# Patient Record
Sex: Female | Born: 1985 | Race: White | Hispanic: No | Marital: Single | Smoking: Former smoker
Health system: Southern US, Community
[De-identification: ages and names within clinical notes are randomized; demographics above are authoritative.]

## PROBLEM LIST (undated history)

## (undated) DIAGNOSIS — J45909 Unspecified asthma, uncomplicated: Secondary | ICD-10-CM

## (undated) HISTORY — PX: NO PAST SURGERIES: SHX2092

---
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
## 2007-03-15 ENCOUNTER — Emergency Department: Payer: Self-pay | Admitting: Emergency Medicine

## 2007-03-22 ENCOUNTER — Emergency Department: Payer: Self-pay | Admitting: Emergency Medicine

## 2007-08-14 ENCOUNTER — Emergency Department: Payer: Self-pay | Admitting: Emergency Medicine

## 2008-03-09 ENCOUNTER — Emergency Department: Payer: Self-pay | Admitting: Emergency Medicine

## 2008-06-24 ENCOUNTER — Emergency Department: Payer: Self-pay | Admitting: Emergency Medicine

## 2008-11-29 ENCOUNTER — Emergency Department: Payer: Self-pay | Admitting: Emergency Medicine

## 2009-03-09 ENCOUNTER — Emergency Department: Payer: Self-pay | Admitting: Emergency Medicine

## 2011-03-27 ENCOUNTER — Emergency Department: Payer: Self-pay | Admitting: *Deleted

## 2011-08-28 ENCOUNTER — Emergency Department: Payer: Self-pay | Admitting: Emergency Medicine

## 2011-08-29 LAB — COMPREHENSIVE METABOLIC PANEL
Anion Gap: 7 (ref 7–16)
Bilirubin,Total: 0.2 mg/dL (ref 0.2–1.0)
Chloride: 105 mmol/L (ref 98–107)
Co2: 28 mmol/L (ref 21–32)
EGFR (African American): >60
EGFR (Non-African Amer.): >60
Potassium: 3.4 mmol/L — ABNORMAL LOW (ref 3.5–5.1)
SGOT(AST): 18 U/L (ref 15–37)
SGPT (ALT): 19 U/L

## 2011-08-29 LAB — URINALYSIS, COMPLETE
Bacteria: NONE SEEN
Bilirubin,UR: NEGATIVE
Glucose,UR: NEGATIVE mg/dL (ref 0–75)
Ketone: NEGATIVE
Nitrite: NEGATIVE
Ph: 7 (ref 4.5–8.0)
RBC,UR: 3 /HPF (ref 0–5)
Squamous Epithelial: 3
WBC UR: 2 /HPF (ref 0–5)

## 2011-08-29 LAB — CBC
MCH: 31.2 pg (ref 26.0–34.0)
MCHC: 33.6 g/dL (ref 32.0–36.0)
Platelet: 296 10*3/uL (ref 150–440)
RBC: 4.46 10*6/uL (ref 3.80–5.20)

## 2011-08-29 LAB — PREGNANCY, URINE: Pregnancy Test, Urine: NEGATIVE m[IU]/mL

## 2011-09-26 ENCOUNTER — Emergency Department: Payer: Self-pay | Admitting: *Deleted

## 2011-09-26 LAB — URINALYSIS, COMPLETE
Bacteria: NONE SEEN
Blood: NEGATIVE
Glucose,UR: NEGATIVE mg/dL (ref 0–75)
Ketone: NEGATIVE
Nitrite: NEGATIVE
Specific Gravity: 1.001 (ref 1.003–1.030)
Squamous Epithelial: <1
WBC UR: <1 /HPF (ref 0–5)

## 2011-09-26 LAB — PREGNANCY, URINE: Pregnancy Test, Urine: NEGATIVE m[IU]/mL

## 2012-07-08 ENCOUNTER — Emergency Department: Payer: Self-pay | Admitting: Emergency Medicine

## 2012-08-13 ENCOUNTER — Emergency Department: Payer: Self-pay | Admitting: Unknown Physician Specialty

## 2012-08-13 LAB — COMPREHENSIVE METABOLIC PANEL
Albumin: 4.1 g/dL (ref 3.4–5.0)
Alkaline Phosphatase: 66 U/L (ref 50–136)
Anion Gap: 5 — ABNORMAL LOW (ref 7–16)
BUN: 14 mg/dL (ref 7–18)
Bilirubin,Total: 0.3 mg/dL (ref 0.2–1.0)
Calcium, Total: 9 mg/dL (ref 8.5–10.1)
Chloride: 108 mmol/L — ABNORMAL HIGH (ref 98–107)
Co2: 26 mmol/L (ref 21–32)
EGFR (African American): >60
EGFR (Non-African Amer.): >60
Glucose: 98 mg/dL (ref 65–99)
Potassium: 3.7 mmol/L (ref 3.5–5.1)
SGOT(AST): 14 U/L — ABNORMAL LOW (ref 15–37)
SGPT (ALT): 15 U/L (ref 12–78)

## 2012-08-13 LAB — URINALYSIS, COMPLETE
Bilirubin,UR: NEGATIVE
Ketone: NEGATIVE
Ph: 6 (ref 4.5–8.0)
Protein: NEGATIVE
Specific Gravity: 1.017 (ref 1.003–1.030)
Squamous Epithelial: 2
WBC UR: 6 /HPF (ref 0–5)

## 2012-08-13 LAB — CBC
HCT: 39 % (ref 35.0–47.0)
HGB: 13.2 g/dL (ref 12.0–16.0)
MCH: 30.4 pg (ref 26.0–34.0)
MCHC: 34 g/dL (ref 32.0–36.0)
RBC: 4.35 10*6/uL (ref 3.80–5.20)
WBC: 11.9 10*3/uL — ABNORMAL HIGH (ref 3.6–11.0)

## 2012-08-13 LAB — MAGNESIUM: Magnesium: 1.8 mg/dL

## 2012-08-13 LAB — LIPASE, BLOOD: Lipase: 81 U/L (ref 73–393)

## 2012-09-06 ENCOUNTER — Emergency Department: Payer: Self-pay | Admitting: Emergency Medicine

## 2012-09-08 ENCOUNTER — Emergency Department: Payer: Self-pay | Admitting: Emergency Medicine

## 2012-09-08 LAB — URINALYSIS, COMPLETE
Bacteria: NONE SEEN
Bilirubin,UR: NEGATIVE
Glucose,UR: NEGATIVE mg/dL (ref 0–75)
Ketone: NEGATIVE
Leukocyte Esterase: NEGATIVE
Nitrite: NEGATIVE
Ph: 6 (ref 4.5–8.0)
Protein: NEGATIVE
RBC,UR: 15 /HPF (ref 0–5)
Specific Gravity: 1.014 (ref 1.003–1.030)
WBC UR: 4 /HPF (ref 0–5)

## 2012-09-08 LAB — CBC WITH DIFFERENTIAL/PLATELET
Basophil #: 0.1 10*3/uL (ref 0.0–0.1)
Eosinophil #: 0.1 10*3/uL (ref 0.0–0.7)
Eosinophil %: 1.2 %
HCT: 38.4 % (ref 35.0–47.0)
HGB: 13.3 g/dL (ref 12.0–16.0)
Lymphocyte #: 2.1 10*3/uL (ref 1.0–3.6)
MCH: 30.9 pg (ref 26.0–34.0)
MCHC: 34.7 g/dL (ref 32.0–36.0)
Monocyte %: 7.4 %
Neutrophil #: 7.9 10*3/uL — ABNORMAL HIGH (ref 1.4–6.5)
RBC: 4.33 10*6/uL (ref 3.80–5.20)
RDW: 13.3 % (ref 11.5–14.5)

## 2013-04-14 ENCOUNTER — Emergency Department: Payer: Self-pay | Admitting: Internal Medicine

## 2013-09-28 ENCOUNTER — Emergency Department: Payer: Self-pay | Admitting: Emergency Medicine

## 2014-03-21 ENCOUNTER — Emergency Department: Payer: Self-pay | Admitting: Emergency Medicine

## 2014-03-21 LAB — CBC
HCT: 38.7 % (ref 35.0–47.0)
HGB: 12.9 g/dL (ref 12.0–16.0)
MCH: 30 pg (ref 26.0–34.0)
MCHC: 33.3 g/dL (ref 32.0–36.0)
MCV: 90 fL (ref 80–100)
Platelet: 261 10*3/uL (ref 150–440)
RBC: 4.3 10*6/uL (ref 3.80–5.20)
RDW: 13.2 % (ref 11.5–14.5)
WBC: 6.9 10*3/uL (ref 3.6–11.0)

## 2014-03-21 LAB — BASIC METABOLIC PANEL
Anion Gap: 8 (ref 7–16)
BUN: 12 mg/dL (ref 7–18)
CREATININE: 0.76 mg/dL (ref 0.60–1.30)
Calcium, Total: 9.3 mg/dL (ref 8.5–10.1)
Chloride: 106 mmol/L (ref 98–107)
Co2: 28 mmol/L (ref 21–32)
EGFR (African American): >60
EGFR (Non-African Amer.): >60
Glucose: 104 mg/dL — ABNORMAL HIGH (ref 65–99)
OSMOLALITY: 283 (ref 275–301)
POTASSIUM: 3.6 mmol/L (ref 3.5–5.1)
Sodium: 142 mmol/L (ref 136–145)

## 2014-03-21 LAB — URINALYSIS, COMPLETE
BILIRUBIN, UR: NEGATIVE
BLOOD: NEGATIVE
GLUCOSE, UR: NEGATIVE mg/dL (ref 0–75)
KETONE: NEGATIVE
Leukocyte Esterase: NEGATIVE
Nitrite: NEGATIVE
Ph: 7 (ref 4.5–8.0)
Protein: NEGATIVE
RBC,UR: 5 /HPF (ref 0–5)
SPECIFIC GRAVITY: 1.018 (ref 1.003–1.030)
Squamous Epithelial: 6

## 2014-05-04 ENCOUNTER — Emergency Department: Payer: Self-pay | Admitting: Emergency Medicine

## 2014-12-27 ENCOUNTER — Emergency Department
Admission: EM | Admit: 2014-12-27 | Discharge: 2014-12-27 | Disposition: A | Discharge status: Home / Self Care | Payer: Self-pay | Attending: Emergency Medicine | Admitting: Emergency Medicine

## 2014-12-27 ENCOUNTER — Encounter: Payer: Self-pay | Admitting: Emergency Medicine

## 2014-12-27 ENCOUNTER — Emergency Department: Payer: Self-pay

## 2014-12-27 DIAGNOSIS — Z720 Tobacco use: Secondary | ICD-10-CM | POA: Insufficient documentation

## 2014-12-27 DIAGNOSIS — Z91040 Latex allergy status: Secondary | ICD-10-CM | POA: Insufficient documentation

## 2014-12-27 DIAGNOSIS — Z3202 Encounter for pregnancy test, result negative: Secondary | ICD-10-CM | POA: Insufficient documentation

## 2014-12-27 DIAGNOSIS — R05 Cough: Secondary | ICD-10-CM | POA: Insufficient documentation

## 2014-12-27 DIAGNOSIS — Z79899 Other long term (current) drug therapy: Secondary | ICD-10-CM | POA: Insufficient documentation

## 2014-12-27 DIAGNOSIS — R059 Cough, unspecified: Secondary | ICD-10-CM

## 2014-12-27 DIAGNOSIS — J45909 Unspecified asthma, uncomplicated: Secondary | ICD-10-CM | POA: Insufficient documentation

## 2014-12-27 HISTORY — DX: Unspecified asthma, uncomplicated: J45.909

## 2014-12-27 LAB — BASIC METABOLIC PANEL
ANION GAP: 7 (ref 5–15)
BUN: 10 mg/dL (ref 6–20)
CO2: 27 mmol/L (ref 22–32)
Calcium: 9.4 mg/dL (ref 8.9–10.3)
Chloride: 108 mmol/L (ref 101–111)
Creatinine, Ser: 0.78 mg/dL (ref 0.44–1.00)
GFR calc non Af Amer: >60 mL/min (ref >60)
Glucose, Bld: 79 mg/dL (ref 65–99)
POTASSIUM: 3.9 mmol/L (ref 3.5–5.1)
Sodium: 142 mmol/L (ref 135–145)

## 2014-12-27 LAB — CBC
HCT: 38.4 % (ref 35.0–47.0)
HEMOGLOBIN: 12.9 g/dL (ref 12.0–16.0)
MCH: 30.8 pg (ref 26.0–34.0)
MCHC: 33.7 g/dL (ref 32.0–36.0)
MCV: 91.3 fL (ref 80.0–100.0)
Platelets: 277 10*3/uL (ref 150–440)
RBC: 4.21 MIL/uL (ref 3.80–5.20)
RDW: 12.9 % (ref 11.5–14.5)
WBC: 6 10*3/uL (ref 3.6–11.0)

## 2014-12-27 MED ORDER — BENZONATATE 100 MG PO CAPS: 100.0000 mg | ORAL_CAPSULE | Freq: Four times a day (QID) | ORAL | Status: AC | PRN

## 2014-12-27 MED ORDER — AZITHROMYCIN 250 MG PO TABS: ORAL_TABLET | ORAL | Status: AC

## 2014-12-27 NOTE — ED Provider Notes (Signed)
[facility] Emergency Department Provider Note  ____________________________________________   I have reviewed the triage vital signs and the nursing notes.   HISTORY  Chief Complaint Weakness    HPI Kaitlyn Russell is a 29 y.o. female With a history of asthma who is no evidence abated as well as ongoing tobacco abuse states she's been coughing for last 3 or 4 days. Last night she coughed so hard she threw up. She is not had any fever or chills no leg swelling no chest pain no nausea no vomiting otherwise. She does not have a productive cough positive mild rhinorrhea URI symptoms however. No recent travel leg swelling and she denies pregnancy  Past Medical History  Diagnosis Date  . Asthma     There are no active problems to display for this patient.   History reviewed. No pertinent past surgical history.  Current Outpatient Rx  Name  Route  Sig  Dispense  Refill  . ferrous sulfate 325 (65 FE) MG tablet   Oral   Take 325 mg by mouth daily with breakfast.           Allergies Latex  History reviewed. No pertinent family history.  Social History Social History  Substance Use Topics  . Smoking status: Current Every Day Smoker  . Smokeless tobacco: None  . Alcohol Use: Yes    Review of Systems Constitutional: No fever/chills Eyes: No visual changes. ENT: No sore throat. No stiff neck no neck pain Cardiovascular: Denies chest pain. Respiratory: Denies shortness of breath. Gastrointestinal:   no vomiting.  No diarrhea.  No constipation. Genitourinary: Negative for dysuria. Musculoskeletal: Negative lower extremity swelling Skin: Negative for rash. Neurological: Negative for headaches, focal weakness or numbness. 10-point ROS otherwise negative.  ____________________________________________   PHYSICAL EXAM:  VITAL SIGNS: ED Triage Vitals  Enc Vitals Group     BP 12/27/14 1042 108/66 mmHg     Pulse Rate 12/27/14 1042 98     Resp  12/27/14 1349 22     Temp 12/27/14 1042 98.4 F (36.9 C)     Temp Source 12/27/14 1042 Oral     SpO2 12/27/14 1042 99 %     Weight 12/27/14 1042 114 lb (51.71 kg)     Height 12/27/14 1042 5\' 3"  (1.6 m)     Head Cir --      Peak Flow --      Pain Score 12/27/14 1043 0     Pain Loc --      Pain Edu? --      Excl. in GC? --     Constitutional: Alert and oriented. Well appearing and in no acute distress. Eyes: Conjunctivae are normal. PERRL. EOMI. Head: Atraumatic. Nose: No congestion/rhinnorhea. Mouth/Throat: Mucous membranes are moist.  Oropharynx non-erythematous. Neck: No stridor.   Nontender with no meningismus Cardiovascular: Normal rate, regular rhythm. Grossly normal heart sounds.  Good peripheral circulation. Respiratory: Normal respiratory effort.  No retractions. Lungs CTAB. Abdominal: Soft and nontender. No distention. No guarding no rebound Back:  There is no focal tenderness or step off there is no midline tenderness there are no lesions noted. there is no CVA tenderness Musculoskeletal: No lower extremity tenderness. No joint effusions, no DVT signs strong distal pulses no edema Neurologic:  Normal speech and language. No gross focal neurologic deficits are appreciated.  Skin:  Skin is warm, dry and intact. No rash noted. Psychiatric: Mood and affect are normal. Speech and behavior are normal.  ____________________________________________   LABS (  all labs ordered are listed, but only abnormal results are displayed)  Labs Reviewed  BASIC METABOLIC PANEL  CBC  POC URINE PREG, ED   ____________________________________________  EKG  Obtained by triage interpreted by me, normal sinus rhythm rate 64 bpm, no acute ST elevation or depression nonspecific ST changes noted ____________________________________________  RADIOLOGY  Reviewed by me ____________________________________________   PROCEDURES  Procedure(s) performed: None  Critical Care performed:  None  ____________________________________________   INITIAL IMPRESSION / ASSESSMENT AND PLAN / ED COURSE  Pertinent labs & imaging results that were available during my care of the patient were reviewed by me and considered in my medical decision making (see chart for details).  Patient with cough and feeling unwell. Very well-appearing however. Using her telephone. We'll obtain a chest x-ray as she is a smoker. I do not auscultate wheezes and she states at this time she is having no trouble breathing. Sats are 100% she has no increased respiratory effort. I will check a pregnancy test. Patient states she has a history of anemia but there is none in evidence today. ____________________________________________   FINAL CLINICAL IMPRESSION(S) / ED DIAGNOSES  Final diagnoses:  None     Jeanmarie Plant, MD 12/27/14 1432

## 2014-12-27 NOTE — ED Notes (Signed)
Pt to triage c/o weakness, nausea with emesis yesterday, unable to eat or drink. Denies diarrhea. Was diagnosed with anemia last month

## 2015-03-27 ENCOUNTER — Emergency Department: Payer: Self-pay

## 2015-03-27 ENCOUNTER — Encounter: Payer: Self-pay | Admitting: Emergency Medicine

## 2015-03-27 ENCOUNTER — Emergency Department
Admission: EM | Admit: 2015-03-27 | Discharge: 2015-03-27 | Disposition: A | Discharge status: Home / Self Care | Payer: Self-pay | Attending: Emergency Medicine | Admitting: Emergency Medicine

## 2015-03-27 DIAGNOSIS — R091 Pleurisy: Secondary | ICD-10-CM | POA: Insufficient documentation

## 2015-03-27 DIAGNOSIS — Z91040 Latex allergy status: Secondary | ICD-10-CM | POA: Insufficient documentation

## 2015-03-27 DIAGNOSIS — Z3202 Encounter for pregnancy test, result negative: Secondary | ICD-10-CM | POA: Insufficient documentation

## 2015-03-27 DIAGNOSIS — Z79899 Other long term (current) drug therapy: Secondary | ICD-10-CM | POA: Insufficient documentation

## 2015-03-27 DIAGNOSIS — R079 Chest pain, unspecified: Secondary | ICD-10-CM | POA: Insufficient documentation

## 2015-03-27 DIAGNOSIS — F17200 Nicotine dependence, unspecified, uncomplicated: Secondary | ICD-10-CM | POA: Insufficient documentation

## 2015-03-27 LAB — BASIC METABOLIC PANEL
Anion gap: 5 (ref 5–15)
BUN: 13 mg/dL (ref 6–20)
CHLORIDE: 106 mmol/L (ref 101–111)
CO2: 28 mmol/L (ref 22–32)
Calcium: 9.8 mg/dL (ref 8.9–10.3)
Creatinine, Ser: 0.71 mg/dL (ref 0.44–1.00)
GFR calc Af Amer: >60 mL/min (ref >60)
GFR calc non Af Amer: >60 mL/min (ref >60)
GLUCOSE: 94 mg/dL (ref 65–99)
POTASSIUM: 4.2 mmol/L (ref 3.5–5.1)
Sodium: 139 mmol/L (ref 135–145)

## 2015-03-27 LAB — CBC
HEMATOCRIT: 39.5 % (ref 35.0–47.0)
HEMOGLOBIN: 13.3 g/dL (ref 12.0–16.0)
MCH: 30.8 pg (ref 26.0–34.0)
MCHC: 33.6 g/dL (ref 32.0–36.0)
MCV: 91.5 fL (ref 80.0–100.0)
Platelets: 257 10*3/uL (ref 150–440)
RBC: 4.32 MIL/uL (ref 3.80–5.20)
RDW: 12.9 % (ref 11.5–14.5)
WBC: 6.6 10*3/uL (ref 3.6–11.0)

## 2015-03-27 LAB — POCT PREGNANCY, URINE: Preg Test, Ur: NEGATIVE

## 2015-03-27 LAB — TROPONIN I: Troponin I: <0.03 ng/mL (ref <0.031)

## 2015-03-27 MED ORDER — KETOROLAC TROMETHAMINE 60 MG/2ML IM SOLN
60.0000 mg | Freq: Once | INTRAMUSCULAR | Status: AC
  Administered 2015-03-27: 60 mg via INTRAMUSCULAR

## 2015-03-27 MED ORDER — IBUPROFEN 600 MG PO TABS: 600.0000 mg | ORAL_TABLET | Freq: Three times a day (TID) | ORAL | Status: DC | PRN

## 2015-03-27 MED ORDER — KETOROLAC TROMETHAMINE 60 MG/2ML IM SOLN
INTRAMUSCULAR | Status: AC
  Administered 2015-03-27: 60 mg via INTRAMUSCULAR
  Filled 2015-03-27: qty 2

## 2015-03-27 NOTE — Discharge Instructions (Signed)
Please seek medical attention for any high fevers, chest pain, shortness of breath, change in behavior, persistent vomiting, bloody stool or any other new or concerning symptoms. ° ° °Pleurisy °Pleurisy is an inflammation and swelling of the lining of the lungs (pleura). Because of this inflammation, it hurts to breathe. It can be aggravated by coughing, laughing, or deep breathing. Pleurisy is often caused by an underlying infection or disease.  °HOME CARE INSTRUCTIONS  °Monitor your pleurisy for any changes. The following actions may help to alleviate any discomfort you are experiencing: °· Medicine may help with pain. Only take over-the-counter or prescription medicines for pain, discomfort, or fever as directed by your health care provider. °· Only take antibiotic medicine as directed. Make sure to finish it even if you start to feel better. °SEEK MEDICAL CARE IF:  °· Your pain is not controlled with medicine or is increasing. °· You have an increase in pus-like (purulent) secretions brought up with coughing. °SEEK IMMEDIATE MEDICAL CARE IF:  °· You have blue or dark lips, fingernails, or toenails. °· You are coughing up blood. °· You have increased difficulty breathing. °· You have continuing pain unrelieved by medicine or pain lasting more than 1 week. °· You have pain that radiates into your neck, arms, or jaw. °· You develop increased shortness of breath or wheezing. °· You develop a fever, rash, vomiting, fainting, or other serious symptoms. °MAKE SURE YOU: °· Understand these instructions.   °· Will watch your condition.   °· Will get help right away if you are not doing well or get worse. °  °  °This information is not intended to replace advice given to you by your health care provider. Make sure you discuss any questions you have with your health care provider. °  °Document Released: 02/10/2005 Document Revised: 10/13/2012 Document Reviewed: 07/25/2012 °Elsevier Interactive Patient Education ©2016  [employer] ° °

## 2015-03-27 NOTE — ED Provider Notes (Signed)
[facility] Emergency Department Provider Note   ____________________________________________  Time seen: ~1430  I have reviewed the triage vital signs and the nursing notes.   HISTORY  Chief Complaint Chest Pain   History limited by: Not Limited   HPI Kaitlyn Russell is a 30 y.o. female who presents to the emergency department today because of concerns for chest pain. She states that it started 6 hours ago. It started suddenly. She describes it as being located in the central and left chest. She describes it as sharp. It will become severe. It is worse with deep breaths. It is worse with lying on her left side. She denies any associated cough. Denies any associated shortness of breath. Denies any associated fevers. Denies similar symptoms in the past.     Past Medical History  Diagnosis Date  . Asthma     There are no active problems to display for this patient.   History reviewed. No pertinent past surgical history.  Current Outpatient Rx  Name  Route  Sig  Dispense  Refill  . benzonatate (TESSALON PERLES) 100 MG capsule   Oral   Take 1 capsule (100 mg total) by mouth every 6 (six) hours as needed for cough.   30 capsule   0   . ferrous sulfate 325 (65 FE) MG tablet   Oral   Take 325 mg by mouth daily with breakfast.           Allergies Latex  History reviewed. No pertinent family history.  Social History Social History  Substance Use Topics  . Smoking status: Current Every Day Smoker  . Smokeless tobacco: None  . Alcohol Use: Yes    Review of Systems  Constitutional: Negative for fever. Cardiovascular: Positive for chest pain. Respiratory: Negative for shortness of breath. Gastrointestinal: Negative for abdominal pain, vomiting and diarrhea. Neurological: Negative for headaches, focal weakness or numbness.   10-point ROS otherwise negative.  ____________________________________________   PHYSICAL EXAM:  VITAL  SIGNS: ED Triage Vitals  Enc Vitals Group     BP 03/27/15 1010 110/65 mmHg     Pulse --      Resp 03/27/15 1010 18     Temp 03/27/15 1010 98.4 F (36.9 C)     Temp Source 03/27/15 1010 Oral     SpO2 03/27/15 1010 97 %     Weight 03/27/15 1010 114 lb (51.71 kg)     Height 03/27/15 1010  (1.6 m)     Head Cir --      Peak Flow --      Pain Score 03/27/15 1011 10   Constitutional: Alert and oriented. Well appearing and in no distress. Eyes: Conjunctivae are normal. PERRL. Normal extraocular movements. ENT   Head: Normocephalic and atraumatic.   Nose: No congestion/rhinnorhea.   Mouth/Throat: Mucous membranes are moist.   Neck: No stridor. Hematological/Lymphatic/Immunilogical: No cervical lymphadenopathy. Cardiovascular: Normal rate, regular rhythm.  No murmurs, rubs, or gallops. Respiratory: Normal respiratory effort without tachypnea nor retractions. Breath sounds are clear and equal bilaterally. No wheezes/rales/rhonchi. Genitourinary: Deferred Musculoskeletal: Normal range of motion in all extremities. No joint effusions.  No lower extremity tenderness nor edema. Neurologic:  Normal speech and language. No gross focal neurologic deficits are appreciated.  Skin:  Skin is warm, dry and intact. No rash noted. Psychiatric: Mood and affect are normal. Speech and behavior are normal. Patient exhibits appropriate insight and judgment.  ____________________________________________    LABS (pertinent positives/negatives)  Labs Reviewed  BASIC  METABOLIC PANEL  TROPONIN I  CBC  POCT PREGNANCY, URINE  POC URINE PREG, ED     ____________________________________________   EKG  I, Phineas Semen, attending physician, personally viewed and interpreted this EKG  EKG Time: 1007 Rate: 75 Rhythm: normal sinus rhythm Axis: normal Intervals: qtc 402 QRS: narrow ST changes: no st elevation Impression: normal ekg ____________________________________________     RADIOLOGY  CXR IMPRESSION: No active cardiopulmonary disease.  ____________________________________________   PROCEDURES  Procedure(s) performed: None  Critical Care performed: No  ____________________________________________   INITIAL IMPRESSION / ASSESSMENT AND PLAN / ED COURSE  Pertinent labs & imaging results that were available during my care of the patient were reviewed by me and considered in my medical decision making (see chart for details).  Patient presented to the emergency department today with concerns for chest pain. It is pleuritic in nature. Patient perc negative. Troponin negative and patient is at low risk for ACS. I doubt dissection. Chest x-ray normal, doubt pneumothorax or pneumonia. The patient likely with pleurisy. Will give Toradol IM here in prescription for high-dose ibuprofen.  ____________________________________________   FINAL CLINICAL IMPRESSION(S) / ED DIAGNOSES  Final diagnoses:  Pleurisy  Chest pain, unspecified chest pain type     Phineas Semen, MD 03/27/15 1445

## 2015-03-27 NOTE — ED Notes (Signed)
Pt informed to return if any life threatening symptoms occur.  

## 2015-03-27 NOTE — ED Notes (Signed)
Pt to ed with c/o chest pain that started at 0830 this am while she was at work,  Pt states pain is sharpe and shooting and gets worse with deep breath.  Pt denies all other symptoms, states the pain is central chest and radiates to left breast.

## 2016-01-15 ENCOUNTER — Emergency Department: Payer: Medicaid Other

## 2016-01-15 ENCOUNTER — Emergency Department
Admission: EM | Admit: 2016-01-15 | Discharge: 2016-01-15 | Disposition: A | Discharge status: Home / Self Care | Payer: Medicaid Other | Attending: Emergency Medicine | Admitting: Emergency Medicine

## 2016-01-15 ENCOUNTER — Encounter: Payer: Self-pay | Admitting: Emergency Medicine

## 2016-01-15 DIAGNOSIS — F17200 Nicotine dependence, unspecified, uncomplicated: Secondary | ICD-10-CM | POA: Diagnosis not present

## 2016-01-15 DIAGNOSIS — R0781 Pleurodynia: Secondary | ICD-10-CM | POA: Diagnosis present

## 2016-01-15 DIAGNOSIS — Z79899 Other long term (current) drug therapy: Secondary | ICD-10-CM | POA: Diagnosis not present

## 2016-01-15 DIAGNOSIS — J45909 Unspecified asthma, uncomplicated: Secondary | ICD-10-CM | POA: Diagnosis not present

## 2016-01-15 DIAGNOSIS — R079 Chest pain, unspecified: Secondary | ICD-10-CM

## 2016-01-15 LAB — CBC
HEMATOCRIT: 38.2 % (ref 35.0–47.0)
HEMOGLOBIN: 13.4 g/dL (ref 12.0–16.0)
MCH: 31.4 pg (ref 26.0–34.0)
MCHC: 35.2 g/dL (ref 32.0–36.0)
MCV: 89.2 fL (ref 80.0–100.0)
PLATELETS: 262 10*3/uL (ref 150–440)
RBC: 4.28 MIL/uL (ref 3.80–5.20)
RDW: 13.2 % (ref 11.5–14.5)
WBC: 6.3 10*3/uL (ref 3.6–11.0)

## 2016-01-15 LAB — BASIC METABOLIC PANEL
ANION GAP: 8 (ref 5–15)
BUN: 16 mg/dL (ref 6–20)
CHLORIDE: 107 mmol/L (ref 101–111)
CO2: 22 mmol/L (ref 22–32)
CREATININE: 0.64 mg/dL (ref 0.44–1.00)
Calcium: 9.6 mg/dL (ref 8.9–10.3)
GFR calc non Af Amer: >60 mL/min (ref >60)
Glucose, Bld: 92 mg/dL (ref 65–99)
POTASSIUM: 3.8 mmol/L (ref 3.5–5.1)
SODIUM: 137 mmol/L (ref 135–145)

## 2016-01-15 LAB — TROPONIN I: Troponin I: <0.03 ng/mL (ref <0.03)

## 2016-01-15 LAB — POCT PREGNANCY, URINE: PREG TEST UR: NEGATIVE

## 2016-01-15 MED ORDER — ASPIRIN 81 MG PO CHEW: 324.0000 mg | CHEWABLE_TABLET | Freq: Once | ORAL | Status: DC

## 2016-01-15 NOTE — ED Notes (Signed)
Gave patient urine specimen cup for a sample. Does not have to urinate at this time. Encouraged patient to try as soon as she can.   Patient transported to x-ray.

## 2016-01-15 NOTE — ED Provider Notes (Signed)
[facility] Emergency Department Provider Note  ____________________________________________  Time seen: Approximately 9:00 AM  I have reviewed the triage vital signs and the nursing notes.   HISTORY  Chief Complaint Chest Pain   HPI Kaitlyn Russell is a 30 y.o. female history of asthma and smoking who presents for evaluation of chest pain. Patient reports sudden onset of left-sided pleuritic chest pain that happened at 7:30 AM while she was at work. 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 The pain is intermittent lasting a few seconds at a time, sharp, located under her left breast. She denies shortness of breath, diaphoresis, nausea, vomiting, abdominal pain, wheezing, coughing. Patient is a smoker. She has no family history of blood clots or ischemic heart disease, no recent travel or immobilization, no leg pain or swelling, no exogenous hormones. Currently patient has no chest pain.  Past Medical History:  Diagnosis Date  . Asthma     There are no active problems to display for this patient.   History reviewed. No pertinent surgical history.  Prior to Admission medications   Medication Sig Start Date End Date Taking? Authorizing Provider  Multiple Vitamins-Iron (MULTIVITAMIN/IRON PO) Take 1 tablet by mouth daily.   Yes Historical Provider, MD    Allergies Latex  No family history on file.  Social History Social History  Substance Use Topics  . Smoking status: Current Every Day Smoker  . Smokeless tobacco: Not on file  . Alcohol use Yes    Review of Systems  Constitutional: Negative for fever. Eyes: Negative for visual changes. ENT: Negative for sore throat. Neck: No neck pain  Cardiovascular: + chest pain. Respiratory: Negative for shortness of breath. Gastrointestinal: Negative for abdominal pain, vomiting or diarrhea. Genitourinary: Negative for dysuria. Musculoskeletal: Negative for back pain. Skin:  Negative for rash. Neurological: Negative for headaches, weakness or numbness. Psych: No SI or HI  ____________________________________________   PHYSICAL EXAM:  VITAL SIGNS: ED Triage Vitals [01/15/16 0839]  Enc Vitals Group     BP 109/76     Pulse Rate 80     Resp 16     Temp 98.6 F (37 C)     Temp Source Oral     SpO2 99 %     Weight 115 lb (52.2 kg)     Height 5\' 3"  (1.6 m)     Head Circumference      Peak Flow      Pain Score 0     Pain Loc      Pain Edu?      Excl. in GC?     Constitutional: Alert and oriented. Well appearing and in no apparent distress. HEENT:      Head: Normocephalic and atraumatic.         Eyes: Conjunctivae are normal. Sclera is non-icteric. EOMI. PERRL      Mouth/Throat: Mucous membranes are moist.       Neck: Supple with no signs of meningismus. Cardiovascular: Regular rate and rhythm. No murmurs, gallops, or rubs. 2+ symmetrical distal pulses are present in all extremities. No JVD. Respiratory: Normal respiratory effort. Lungs are clear to auscultation bilaterally. No wheezes, crackles, or rhonchi.  Gastrointestinal: Soft, non tender, and non distended with positive bowel sounds. No rebound or guarding. Musculoskeletal: Nontender with normal range of motion in all extremities. No edema, cyanosis, or erythema of extremities. Neurologic: Normal speech and language. Face is symmetric. Moving all extremities. No gross focal neurologic deficits are  appreciated. Skin: Skin is warm, dry and intact. No rash noted. Psychiatric: Mood and affect are normal. Speech and behavior are normal.  ____________________________________________   LABS (all labs ordered are listed, but only abnormal results are displayed)  Labs Reviewed  BASIC METABOLIC PANEL  CBC  TROPONIN I  TROPONIN I  POC URINE PREG, ED  POCT PREGNANCY, URINE   ____________________________________________  EKG  ED ECG REPORT I, Nita Sicklearolina Aarianna Hoadley, the attending physician,  personally viewed and interpreted this ECG.  Normal sinus rhythm, rate of 76, normal intervals, normal axis, no ST elevations or depressions, T-wave inversion in lead 3. Unchanged from prior ____________________________________________  RADIOLOGY  CXR: Negative ____________________________________________   PROCEDURES  Procedure(s) performed: None Procedures Critical Care performed:  None ____________________________________________   INITIAL IMPRESSION / ASSESSMENT AND PLAN / ED COURSE  30 y.o. female history of asthma and smoking who presents for evaluation of chest pain.   Chest pain in a 30 y.o. female with low suspicion for cardiac (HEART score 1) or other serious etiology (including aortic dissection, pneumonia, pneumothorax, or pulmonary embolism) based her history and physical exam in the ED today. PERC negative. EKG normal. Plan for labs including CBC, chemistries and troponin now and in 3 hours, CXR and re-evaluation for disposition. Will give full dose ASA. Will observe patient on cardiac monitor while in the ED and pain control.     Clinical Course as of Jan 14 1305  Tue Jan 15, 2016  1304 Patient sleeping comfortably with no further episodes of chest pain. Troponin 2 is negative. Chest x-ray, EKG, labs and no acute findings. Patient will be discharged home and follow-up with primary care doctor for further evaluation.  [CV]    Clinical Course User Index [CV] Nita Sicklearolina Alonnah Lampkins, MD    Pertinent labs & imaging results that were available during my care of the patient were reviewed by me and considered in my medical decision making (see chart for details).    ____________________________________________   FINAL CLINICAL IMPRESSION(S) / ED DIAGNOSES  Final diagnoses:  Chest pain, unspecified type      NEW MEDICATIONS STARTED DURING THIS VISIT:  New Prescriptions   No medications on file     Note:  This document was prepared using Dragon voice  recognition software and may include unintentional dictation errors.    Nita Sicklearolina Dixie Jafri, MD 01/15/16 [phone]

## 2016-01-15 NOTE — ED Triage Notes (Signed)
Pt here from work via EMS with c/o left sided chest pain upon inspiration. EMS reports giving 324mg  of ASA and 1 nitro spray. Pt A/O upon arrival.

## 2016-01-15 NOTE — ED Notes (Signed)
Pt returned ambulatory from xray 

## 2016-01-15 NOTE — Discharge Instructions (Signed)

## 2016-02-25 NOTE — L&D Delivery Note (Signed)
Date of delivery: 08/15/16 Estimated Date of Delivery: 09/25/16 LMP: 12/23/15 EGA: [id number]  Delivery Note At 7:07 AM a viable female was delivered via Vaginal, Spontaneous Delivery (Presentation: LOA).  APGAR: 8, 9; weight 4 lb 6.2 oz (1990 g).   Placenta status: intact, spontaneous, sent to pathology.  Cord: 3 vessels,  with the following complications: none apparent.  Cord pH: not collected  Anesthesia:  epidural Episiotomy:  no Lacerations:  1st degree Suture Repair: 3.0 vicryl Est. Blood Loss (mL):  150cc  Mom to postpartum.  Baby to NICU.  Mom presented to L&D with PPROM at 33.6 weeks. Was given Ampicillin, Erythromycin, and Azithromycin, Betamethasone x 2 doses.  At 34 weeks she was given cytotec for cervical ripening x 24hrs. epidual placed. She was then augmented with pitocin.  Progressed to complete, second stage: 1+hr, with delivery of fetal head with restitution to LOT.   Anterior then posterior shoulders delivered without difficulty.  Baby placed on mom's chest, and attended to by peds.  We sang happy birthday to baby Kaitlyn Russell.  Cord was then clamped and cut when pulseless. Cord blood collected. Placenta spontaneously delivered, intact.   IV pitocin given for hemorrhage prophylaxis.  Shallow 1st degree repaired in standard fashion.  Chelsea C Ward 08/15/2016, 7:55 AM

## 2016-03-04 LAB — OB RESULTS CONSOLE HIV ANTIBODY (ROUTINE TESTING): HIV: NONREACTIVE

## 2016-03-04 LAB — OB RESULTS CONSOLE RPR: RPR: NONREACTIVE

## 2016-03-04 LAB — OB RESULTS CONSOLE RUBELLA ANTIBODY, IGM: RUBELLA: IMMUNE

## 2016-03-04 LAB — OB RESULTS CONSOLE VARICELLA ZOSTER ANTIBODY, IGG: VARICELLA IGG: IMMUNE

## 2016-03-14 LAB — OB RESULTS CONSOLE HEPATITIS B SURFACE ANTIGEN: HEP B S AG: NEGATIVE

## 2016-04-06 ENCOUNTER — Emergency Department
Admission: EM | Admit: 2016-04-06 | Discharge: 2016-04-07 | Disposition: A | Discharge status: Home / Self Care | Payer: Medicaid Other | Attending: Emergency Medicine | Admitting: Emergency Medicine

## 2016-04-06 ENCOUNTER — Encounter: Payer: Self-pay | Admitting: Emergency Medicine

## 2016-04-06 DIAGNOSIS — J45909 Unspecified asthma, uncomplicated: Secondary | ICD-10-CM | POA: Diagnosis not present

## 2016-04-06 DIAGNOSIS — O219 Vomiting of pregnancy, unspecified: Secondary | ICD-10-CM | POA: Diagnosis not present

## 2016-04-06 DIAGNOSIS — Z3492 Encounter for supervision of normal pregnancy, unspecified, second trimester: Secondary | ICD-10-CM

## 2016-04-06 DIAGNOSIS — Z3A16 16 weeks gestation of pregnancy: Secondary | ICD-10-CM | POA: Insufficient documentation

## 2016-04-06 DIAGNOSIS — R112 Nausea with vomiting, unspecified: Secondary | ICD-10-CM

## 2016-04-06 DIAGNOSIS — Z91040 Latex allergy status: Secondary | ICD-10-CM | POA: Insufficient documentation

## 2016-04-06 DIAGNOSIS — O99332 Smoking (tobacco) complicating pregnancy, second trimester: Secondary | ICD-10-CM | POA: Insufficient documentation

## 2016-04-06 DIAGNOSIS — F17210 Nicotine dependence, cigarettes, uncomplicated: Secondary | ICD-10-CM | POA: Diagnosis not present

## 2016-04-06 LAB — URINALYSIS, COMPLETE (UACMP) WITH MICROSCOPIC
BILIRUBIN URINE: NEGATIVE
Glucose, UA: NEGATIVE mg/dL
Hgb urine dipstick: NEGATIVE
Ketones, ur: 80 mg/dL — AB
Leukocytes, UA: NEGATIVE
Nitrite: NEGATIVE
PH: 5 (ref 5.0–8.0)
Protein, ur: NEGATIVE mg/dL
SPECIFIC GRAVITY, URINE: 1.024 (ref 1.005–1.030)

## 2016-04-06 LAB — COMPREHENSIVE METABOLIC PANEL
ALBUMIN: 3.7 g/dL (ref 3.5–5.0)
ALT: 11 U/L — AB (ref 14–54)
AST: 20 U/L (ref 15–41)
Alkaline Phosphatase: 42 U/L (ref 38–126)
Anion gap: 9 (ref 5–15)
BUN: 12 mg/dL (ref 6–20)
CHLORIDE: 104 mmol/L (ref 101–111)
CO2: 20 mmol/L — AB (ref 22–32)
CREATININE: 0.41 mg/dL — AB (ref 0.44–1.00)
Calcium: 8.8 mg/dL — ABNORMAL LOW (ref 8.9–10.3)
GFR calc Af Amer: >60 mL/min (ref >60)
GLUCOSE: 90 mg/dL (ref 65–99)
POTASSIUM: 3.5 mmol/L (ref 3.5–5.1)
Sodium: 133 mmol/L — ABNORMAL LOW (ref 135–145)
Total Bilirubin: 0.5 mg/dL (ref 0.3–1.2)
Total Protein: 6.7 g/dL (ref 6.5–8.1)

## 2016-04-06 LAB — CBC
HCT: 33.5 % — ABNORMAL LOW (ref 35.0–47.0)
Hemoglobin: 11.9 g/dL — ABNORMAL LOW (ref 12.0–16.0)
MCH: 31.5 pg (ref 26.0–34.0)
MCHC: 35.6 g/dL (ref 32.0–36.0)
MCV: 88.6 fL (ref 80.0–100.0)
Platelets: 305 10*3/uL (ref 150–440)
RBC: 3.78 MIL/uL — ABNORMAL LOW (ref 3.80–5.20)
RDW: 13 % (ref 11.5–14.5)
WBC: 9.3 10*3/uL (ref 3.6–11.0)

## 2016-04-06 LAB — PREGNANCY, URINE: PREG TEST UR: POSITIVE — AB

## 2016-04-06 LAB — LIPASE, BLOOD: Lipase: 16 U/L (ref 11–51)

## 2016-04-06 LAB — HCG, QUANTITATIVE, PREGNANCY: HCG, BETA CHAIN, QUANT, S: 21900 m[IU]/mL — AB (ref <5)

## 2016-04-06 MED ORDER — SODIUM CHLORIDE 0.9 % IV SOLN
Freq: Once | INTRAVENOUS | Status: AC
  Administered 2016-04-06: 22:00:00 via INTRAVENOUS

## 2016-04-06 MED ORDER — ONDANSETRON 4 MG PO TBDP: 4.0000 mg | ORAL_TABLET | Freq: Three times a day (TID) | ORAL | 0 refills | Status: DC | PRN

## 2016-04-06 MED ORDER — ONDANSETRON HCL 4 MG/2ML IJ SOLN
4.0000 mg | Freq: Once | INTRAMUSCULAR | Status: AC
Start: 2016-04-06 — End: 2016-04-06
  Administered 2016-04-06: 4 mg via INTRAVENOUS
  Filled 2016-04-06: qty 2

## 2016-04-06 MED ORDER — ACETAMINOPHEN 500 MG PO TABS
1000.0000 mg | ORAL_TABLET | Freq: Once | ORAL | Status: AC
  Administered 2016-04-06: 1000 mg via ORAL
  Filled 2016-04-06: qty 2

## 2016-04-06 NOTE — ED Provider Notes (Signed)
[facility] Emergency Department Provider Note        Time seen: ----------------------------------------- 9:44 PM on 04/06/2016 -----------------------------------------    I have reviewed the triage vital signs and the nursing notes.   HISTORY  Chief Complaint Emesis and Generalized Body Aches    HPI Kaitlyn Russell is a 31 y.o. female presents to ER for vomiting. Patient is unable to cannulate time she stopped today. She's not pale keep anything down orally. She's had generalized aches but no diarrhea. She denies fevers, chills or other complaints. Currently she is [redacted] weeks pregnant and has had some lower abdominal pain as well. She denies vaginal bleeding or discharge. G1 P0   Past Medical History:  Diagnosis Date  . Asthma     There are no active problems to display for this patient.   History reviewed. No pertinent surgical history.  Allergies Latex  Social History Social History  Substance Use Topics  . Smoking status: Current Every Day Smoker    Types: Cigarettes  . Smokeless tobacco: Not on file  . Alcohol use Yes    Review of Systems Constitutional: Negative for fever. Cardiovascular: Negative for chest pain. Respiratory: Negative for shortness of breath. Gastrointestinal: Positive for abdominal pain, vomiting Genitourinary: Negative for dysuria. negative for vaginal bleeding or discharge Musculoskeletal: Negative for back pain. Skin: Negative for rash. Neurological: Negative for headaches, focal weakness or numbness.  10-point ROS otherwise negative.  ____________________________________________   PHYSICAL EXAM:  VITAL SIGNS: ED Triage Vitals  Enc Vitals Group     BP 04/06/16 2047 (!) 107/57     Pulse Rate 04/06/16 2047 87     Resp 04/06/16 2047 18     Temp 04/06/16 2047 98.6 F (37 C)     Temp Source 04/06/16 2047 Oral     SpO2 04/06/16 2047 99 %     Weight 04/06/16 2048 127 lb (57.6 kg)     Height 04/06/16  2048 5\' 4"  (1.626 m)     Head Circumference --      Peak Flow --      Pain Score --      Pain Loc --      Pain Edu? --      Excl. in GC? --    Constitutional: Alert and oriented. Well appearing and in no distress. Eyes: Conjunctivae are normal. Normal extraocular movements. ENT   Head: Normocephalic and atraumatic.   Nose: No congestion/rhinnorhea.   Mouth/Throat: Mucous membranes are moist.   Neck: No stridor. Cardiovascular: Normal rate, regular rhythm. No murmurs, rubs, or gallops. Respiratory: Normal respiratory effort without tachypnea nor retractions. Breath sounds are clear and equal bilaterally. No wheezes/rales/rhonchi. Gastrointestinal: Gravid uterus, nontender. Normal bowel sounds. Musculoskeletal: Nontender with normal range of motion in all extremities. No lower extremity tenderness nor edema. Neurologic:  Normal speech and language. No gross focal neurologic deficits are appreciated.  Skin:  Skin is warm, dry and intact. No rash noted. Psychiatric: Mood and affect are normal. Speech and behavior are normal.  ____________________________________________  ED COURSE:  Pertinent labs & imaging results that were available during my care of the patient were reviewed by me and considered in my medical decision making (see chart for details). Patient presents to ER with vomiting and 15 weeks of pregnancy. We will assess with basic labs, give IV fluids and antiemetics. We will also obtain ultrasound imaging.   Procedures ____________________________________________   LABS (pertinent positives/negatives)  Labs Reviewed  COMPREHENSIVE METABOLIC PANEL - Abnormal; Notable  for the following:       Result Value   Sodium 133 (*)    CO2 20 (*)    Creatinine, Ser 0.41 (*)    Calcium 8.8 (*)    ALT 11 (*)    All other components within normal limits  CBC - Abnormal; Notable for the following:    RBC 3.78 (*)    Hemoglobin 11.9 (*)    HCT 33.5 (*)    All other  components within normal limits  URINALYSIS, COMPLETE (UACMP) WITH MICROSCOPIC - Abnormal; Notable for the following:    Color, Urine YELLOW (*)    APPearance CLOUDY (*)    Ketones, ur 80 (*)    Bacteria, UA RARE (*)    Squamous Epithelial / LPF 6-30 (*)    All other components within normal limits  HCG, QUANTITATIVE, PREGNANCY - Abnormal; Notable for the following:    hCG, Beta Chain, Quant, S 21,900 (*)    All other components within normal limits  PREGNANCY, URINE - Abnormal; Notable for the following:    Preg Test, Ur POSITIVE (*)    All other components within normal limits  LIPASE, BLOOD    RADIOLOGY  Pregnancy ultrasound Is pending at this time ____________________________________________  FINAL ASSESSMENT AND PLAN  Second trimester pregnancy, vomiting  Plan: Patient with labs and imaging as dictated above. Patient's labs were reassuring, she received IV fluids, Zofran and pain medication. Ultrasound is pending at this time.   Emily Filbert, MD   Note: This note was generated in part or whole with voice recognition software. Voice recognition is usually quite accurate but there are transcription errors that can and very often do occur. I apologize for any typographical errors that were not detected and corrected.     Emily Filbert, MD 04/06/16 [phone]

## 2016-04-06 NOTE — ED Notes (Signed)
Report to dawn, rn.  

## 2016-04-06 NOTE — ED Triage Notes (Signed)
Pt presents to ED via POV with c/o emesis. Pt states she is unable to count how many times she has thrown up today, pt also c/o generalized body aches at this time. Pt states she is [redacted] weeks pregnant at this time.

## 2016-04-07 ENCOUNTER — Encounter: Payer: Self-pay | Admitting: Radiology

## 2016-04-07 ENCOUNTER — Other Ambulatory Visit: Payer: Medicaid Other

## 2016-04-07 ENCOUNTER — Emergency Department: Payer: Medicaid Other

## 2016-04-07 NOTE — ED Notes (Signed)
Resting quietly with eyes closed.

## 2016-04-07 NOTE — ED Notes (Signed)
Patient transported to Ultrasound 

## 2016-04-07 NOTE — ED Provider Notes (Signed)
Sign off from Dr. Mayford KnifeWilliams in this [age] year old patient with vomiting and lower abdominal pain who is pregnant. Pending ultrasound at this time.  Physical Exam  BP (!) 107/57 (BP Location: Left Arm)   Pulse 87   Temp 98.6 F (37 C) (Oral)   Resp 18   Ht 5\' 4"  (1.626 m)   Wt 127 lb (57.6 kg)   LMP 12/20/2015 (Approximate)   SpO2 99%   BMI 21.80 kg/m   ----------------------------------------- 1:15 AM on 04/07/2016 -----------------------------------------   Physical Exam Patient is sleeping but easily arousable. No complete the pain at this time. ED Course  Procedures  MDM   US OB Limited (Final result)  Result time 04/07/16 01:03:18  Final result by Mitzi HansenLance Furusawa-Stratton, MD (04/07/16 01:03:18)           Narrative:   CLINICAL DATA: 31 y/o F; nausea, vomiting, and abdominal pain for 1 day.  EXAM: LIMITED OBSTETRIC ULTRASOUND  FINDINGS: Number of Fetuses: 1  Heart Rate: 149 bpm  Movement: Yes  Presentation: Breech  Placental Location: Anterior  Previa: Low-lying placenta, nonvisualized internal cervical os.  Amniotic Fluid (Subjective): Within normal limits.  BPD: 3.3cm 16w 1d  MATERNAL FINDINGS:  Cervix: Appears closed.  Uterus/Adnexae: Normal left ovary. Normal right ovary with simple appearing cyst measuring up to 2.6 cm. No significant pelvic fluid.  IMPRESSION: 1. No acute process identified. 2. Single live intrauterine pregnancy. Estimated gestational age of [redacted] weeks and 1 day by BPD. 3. Low-lying placenta, nonvisualized internal cervical os to assess for previa. This exam is performed on an emergent basis and does not comprehensively evaluate fetal size, dating, or anatomy; follow-up complete OB US should be considered if further fetal assessment is warranted.   Electronically Signed By: Mitzi HansenLance Furusawa-Stratton M.D. On: 04/07/2016 01:03            We discussed the ultrasound including the low-lying placenta. The  patient says that [facility]. She says that she is also on prenatal vitamins. She'll be discharged to home.       Myrna Blazeravid Matthew Cassundra Mckeever, MD 04/07/16 [phone]

## 2016-08-13 ENCOUNTER — Inpatient Hospital Stay: Payer: Medicaid Other

## 2016-08-13 ENCOUNTER — Inpatient Hospital Stay
Admission: EM | Admit: 2016-08-13 | Discharge: 2016-08-16 | DRG: 775 | Disposition: A | Discharge status: Home / Self Care | Payer: Medicaid Other | Attending: Obstetrics & Gynecology | Admitting: Obstetrics & Gynecology

## 2016-08-13 DIAGNOSIS — O99335 Smoking (tobacco) complicating the puerperium: Secondary | ICD-10-CM | POA: Diagnosis present

## 2016-08-13 DIAGNOSIS — R4589 Other symptoms and signs involving emotional state: Secondary | ICD-10-CM | POA: Diagnosis present

## 2016-08-13 DIAGNOSIS — O42913 Preterm premature rupture of membranes, unspecified as to length of time between rupture and onset of labor, third trimester: Secondary | ICD-10-CM | POA: Diagnosis present

## 2016-08-13 DIAGNOSIS — O99344 Other mental disorders complicating childbirth: Secondary | ICD-10-CM | POA: Diagnosis present

## 2016-08-13 DIAGNOSIS — Z3689 Encounter for other specified antenatal screening: Secondary | ICD-10-CM

## 2016-08-13 DIAGNOSIS — O99334 Smoking (tobacco) complicating childbirth: Secondary | ICD-10-CM | POA: Diagnosis present

## 2016-08-13 DIAGNOSIS — O42919 Preterm premature rupture of membranes, unspecified as to length of time between rupture and onset of labor, unspecified trimester: Secondary | ICD-10-CM | POA: Diagnosis present

## 2016-08-13 DIAGNOSIS — Z3A33 33 weeks gestation of pregnancy: Secondary | ICD-10-CM

## 2016-08-13 DIAGNOSIS — Z3490 Encounter for supervision of normal pregnancy, unspecified, unspecified trimester: Secondary | ICD-10-CM

## 2016-08-13 DIAGNOSIS — F17210 Nicotine dependence, cigarettes, uncomplicated: Secondary | ICD-10-CM | POA: Diagnosis present

## 2016-08-13 DIAGNOSIS — O368390 Maternal care for abnormalities of the fetal heart rate or rhythm, unspecified trimester, not applicable or unspecified: Secondary | ICD-10-CM | POA: Diagnosis present

## 2016-08-13 DIAGNOSIS — Z91040 Latex allergy status: Secondary | ICD-10-CM

## 2016-08-13 DIAGNOSIS — F419 Anxiety disorder, unspecified: Secondary | ICD-10-CM | POA: Diagnosis present

## 2016-08-13 LAB — COMPREHENSIVE METABOLIC PANEL
ALBUMIN: 3.1 g/dL — AB (ref 3.5–5.0)
ALT: 16 U/L (ref 14–54)
ANION GAP: 8 (ref 5–15)
AST: 30 U/L (ref 15–41)
Alkaline Phosphatase: 130 U/L — ABNORMAL HIGH (ref 38–126)
BILIRUBIN TOTAL: 0.4 mg/dL (ref 0.3–1.2)
BUN: 6 mg/dL (ref 6–20)
CHLORIDE: 107 mmol/L (ref 101–111)
CO2: 23 mmol/L (ref 22–32)
Calcium: 9.5 mg/dL (ref 8.9–10.3)
Creatinine, Ser: 0.41 mg/dL — ABNORMAL LOW (ref 0.44–1.00)
GFR calc Af Amer: >60 mL/min (ref >60)
Glucose, Bld: 88 mg/dL (ref 65–99)
POTASSIUM: 3.7 mmol/L (ref 3.5–5.1)
Sodium: 138 mmol/L (ref 135–145)
Total Protein: 6.4 g/dL — ABNORMAL LOW (ref 6.5–8.1)

## 2016-08-13 LAB — CBC
HEMATOCRIT: 32.9 % — AB (ref 35.0–47.0)
Hemoglobin: 11.3 g/dL — ABNORMAL LOW (ref 12.0–16.0)
MCH: 31 pg (ref 26.0–34.0)
MCHC: 34.4 g/dL (ref 32.0–36.0)
MCV: 90.3 fL (ref 80.0–100.0)
Platelets: 286 10*3/uL (ref 150–440)
RBC: 3.64 MIL/uL — ABNORMAL LOW (ref 3.80–5.20)
RDW: 13.2 % (ref 11.5–14.5)
WBC: 14.2 10*3/uL — ABNORMAL HIGH (ref 3.6–11.0)

## 2016-08-13 LAB — RAPID HIV SCREEN (HIV 1/2 AB+AG)
HIV 1/2 ANTIBODIES: NONREACTIVE
HIV-1 P24 ANTIGEN - HIV24: NONREACTIVE

## 2016-08-13 LAB — URINE DRUG SCREEN, QUALITATIVE (ARMC ONLY)
AMPHETAMINES, UR SCREEN: NOT DETECTED
Barbiturates, Ur Screen: NOT DETECTED
Benzodiazepine, Ur Scrn: NOT DETECTED
COCAINE METABOLITE, UR ~~LOC~~: NOT DETECTED
Cannabinoid 50 Ng, Ur ~~LOC~~: NOT DETECTED
MDMA (ECSTASY) UR SCREEN: NOT DETECTED
Methadone Scn, Ur: NOT DETECTED
Opiate, Ur Screen: NOT DETECTED
Phencyclidine (PCP) Ur S: NOT DETECTED
TRICYCLIC, UR SCREEN: NOT DETECTED

## 2016-08-13 LAB — CHLAMYDIA/NGC RT PCR (ARMC ONLY)
Chlamydia Tr: NOT DETECTED
N gonorrhoeae: NOT DETECTED

## 2016-08-13 LAB — TYPE AND SCREEN
ABO/RH(D): O POS
Antibody Screen: NEGATIVE

## 2016-08-13 MED ORDER — LACTATED RINGERS IV SOLN
INTRAVENOUS | Status: DC
Start: 2016-08-13 — End: 2016-08-16
  Administered 2016-08-13 (×2): via INTRAVENOUS
  Administered 2016-08-13: 1000 mL via INTRAVENOUS
  Administered 2016-08-14 (×3): via INTRAVENOUS

## 2016-08-13 MED ORDER — BETAMETHASONE SOD PHOS & ACET 6 (3-3) MG/ML IJ SUSP
INTRAMUSCULAR | Status: AC
  Administered 2016-08-13: 12 mg via INTRAMUSCULAR
  Filled 2016-08-13: qty 1

## 2016-08-13 MED ORDER — DEXTROSE 5 % IV SOLN: 250.0000 mg | Freq: Four times a day (QID) | INTRAVENOUS | Status: DC

## 2016-08-13 MED ORDER — SODIUM CHLORIDE 0.9 % IV SOLN
250.0000 mg | Freq: Four times a day (QID) | INTRAVENOUS | Status: DC
  Administered 2016-08-13: 250 mg via INTRAVENOUS
  Filled 2016-08-13 (×3): qty 5

## 2016-08-13 MED ORDER — SODIUM CHLORIDE 0.9 % IV SOLN
2.0000 g | Freq: Four times a day (QID) | INTRAVENOUS | Status: DC
  Administered 2016-08-13 (×3): 2 g via INTRAVENOUS
  Filled 2016-08-13 (×3): qty 2000

## 2016-08-13 MED ORDER — AZITHROMYCIN 250 MG PO TABS
1000.0000 mg | ORAL_TABLET | Freq: Once | ORAL | Status: AC
  Administered 2016-08-13: 1000 mg via ORAL
  Filled 2016-08-13: qty 4

## 2016-08-13 MED ORDER — SODIUM CHLORIDE 0.9 % IV SOLN
2.0000 g | Freq: Four times a day (QID) | INTRAVENOUS | Status: DC
  Administered 2016-08-13 – 2016-08-15 (×6): 2 g via INTRAVENOUS
  Filled 2016-08-13 (×12): qty 2000

## 2016-08-13 MED ORDER — LACTATED RINGERS IV SOLN: 500.0000 mL | INTRAVENOUS | Status: DC | PRN

## 2016-08-13 MED ORDER — MISOPROSTOL 25 MCG QUARTER TABLET
25.0000 ug | ORAL_TABLET | ORAL | Status: DC
  Administered 2016-08-14 (×5): 25 ug via ORAL
  Filled 2016-08-13 (×5): qty 1

## 2016-08-13 MED ORDER — NICOTINE POLACRILEX 2 MG MT GUM
2.0000 mg | CHEWING_GUM | OROMUCOSAL | Status: DC | PRN
  Filled 2016-08-13: qty 1

## 2016-08-13 MED ORDER — BETAMETHASONE SOD PHOS & ACET 6 (3-3) MG/ML IJ SUSP
12.0000 mg | INTRAMUSCULAR | Status: AC
  Administered 2016-08-13 – 2016-08-14 (×2): 12 mg via INTRAMUSCULAR
  Filled 2016-08-13: qty 2

## 2016-08-13 NOTE — Progress Notes (Signed)
Patient ID: Kaitlyn Russell, female   DOB: 03-12-1985, 31 y.o.   MRN: [id number] Spoke with Dr Johnathan HausenLiningston today regarding [facility]'s management of late preterm PRROM . They currently induce labor at 34 week in not in activ e  Labor post PPROM . Risks of infection , cord accident , abruption vs premature delivery .  U/S today: CLINICAL DATA:  Evaluation of fetal position and weight  EXAM: OBSTETRICAL ULTRASOUND >14 WKS  FINDINGS: Number of Fetuses: 1  Heart Rate:  120 bpm  Movement: Present  Presentation: Cephalic  Previa: None. The distance from the lower placental segment to the internal cervical os is 7.8 cm.  Placental Location: Anterior and partially fundal.  Amniotic Fluid (Subjective): Decreased  Amniotic fluid objective:  AFI 7.8 cm.  Largest pocket 3.59 cm  FETAL BIOMETRY  BPD:  7.9cm 31w 4d  HC:    29.5cm  32w   4d  AC:   29.9cm  33w   6d  FL:   6.3cm  32w   ford  Current Mean GA: 32w 5d              US EDC: October 03, 2016  Estimated Fetal Weight:  2130g    23%ile  FETAL ANATOMY  Lateral Ventricles: Visualize  Thalami/CSP: Visualized  Posterior Fossa:  Visualized  Nuchal Region: Grossly normal.  Upper Lip: Not visualized  Spine: Visualized  4 Chamber Heart on Left: Visualized  LVOT: Visualized  RVOT: Visualized  Stomach on Left: Visualized  3 Vessel Cord: Visualized  Cord Insertion site: Visualized  Kidneys: Visualized  Bladder: Visualized  Extremities: Visualize  Sex: Not visualized  Technically difficult due to: Advanced gestational age in loss of amniotic fluid.  Maternal Findings:  Cervix:  3.8 cm in length.  IMPRESSION: Single viable IUP with cephalic presentation and anterior placenta without evidence of placenta previa. Fetal anatomy is normal where visualized. The estimated gestational age is 32 weeks 5 days with estimated date of confinement of October 03, 2016. The fetal weight is  2130 g which is at the twenty-third percentile.    Continue ABX and willl start Pitocin in am  Repeat cbc in am . If chorioamnionitis is diagnosed I will broadenABX  Continuous monitoring.  Nicorette gum

## 2016-08-13 NOTE — Consult Note (Signed)
Neonatology Consult to Antenatal Patient:  I was asked by Dr. Elesa MassedWard to see this patient in order to provide antenatal counseling due to premature ROM and anticipated preterm delivery.  Ms. Kaitlyn Russell was admitted this morning at 7233 6/[redacted] weeks GA after having SROM. She is currently not having active labor. She is getting BMZ and latency antibiotics. This is her first baby, a female. She is a cigarette smoker, UDS negative, and has a history of anxiety.  I spoke with the patient and 2 family members. Transition nurse Candyce ChurnS. Janicello was also present. We discussed the likely case of delivery in the next 1-2 days, including usual DR management, possible respiratory complications and need for support, IV access, feedings (mother desires formula feeding), LOS, Mortality and Morbidity, and long term outcomes. Most of her questions were in regard to her desire for more experienced staff members to take care of her baby. I offered a NICU tour to any interested family members and would be glad to come back if she has more questions later.  Thank you for asking me to see this patient.  Doretha Souhristie C. Jasiyah Paulding, MD Neonatologist  The total length of face-to-face or floor/unit time for this encounter was 20 minutes. Counseling and/or coordination of care was 15 minutes of the above.

## 2016-08-13 NOTE — Progress Notes (Signed)
Was notified by pharmacy there is a Sport and exercise psychologistnational shortage of Erythromycin after one dose of 250mg  IV was already given.  They do not have enough in house to adequately treat Ms. Lawerance Sabaloseman.  Alternative treatment for PPROM is a single dose of Azithromycin 1000mg , PO.  This was given just after 8am.    ----- Ranae Plumberhelsea Shawanda Sievert, MD Attending Obstetrician and Gynecologist [facility], Department of OB/GYN [facility]

## 2016-08-13 NOTE — H&P (Signed)
OB History & Physical   History of Present Illness:  Chief Complaint:   HPI:  Kaitlyn Russell is a 31 y.o. G1P0000 female at 5570w6d dated by LMP of 12/22/16, with EDC of 09/25/16.  She presents to L&D with spontaneous gush of fluid at 2330 last night.    +FM, no CTX, + LOF, no VB  Pregnancy Issues: 1. Smoker  2. Anxiety 3. Resolved low-lying placenta 4. Fetal arrhythmia s/p ECHO wnl - not reproduced on exams   Maternal Medical History:   Past Medical History:  Diagnosis Date  . Asthma     History reviewed. No pertinent surgical history.  Allergies  Allergen Reactions  . Prednisone   . Latex Rash    Prior to Admission medications   Medication Sig Start Date End Date Taking? Authorizing Provider  Multiple Vitamins-Iron (MULTIVITAMIN/IRON PO) Take 1 tablet by mouth daily.   Yes [provider]  ondansetron (ZOFRAN ODT) 4 MG disintegrating tablet Take 1 tablet (4 mg total) by mouth every 8 (eight) hours as needed for nausea or vomiting. Patient not taking: Reported on 08/13/2016 04/06/16   Emily FilbertWilliams, Jonathan E, MD     [facility]     Social History: She  reports that she has been smoking Cigarettes.  She has never used smokeless tobacco. She reports that she does not drink alcohol or use drugs.  Family History: no gyn cancers, heart disease pancreatic cancer in maternal grandmother, cervical cancer in mom  Review of Systems: A full review of systems was performed and negative except as noted in the HPI.     Physical Exam:  Vital Signs: BP 96/68   Pulse 75   Temp 98.2 F (36.8 C) (Oral)   Resp 18   Ht 5\' 3"  (1.6 m)   Wt 68 kg (150 lb)   LMP  (Exact Date)   BMI 26.57 kg/m  General: no acute distress.  HEENT: normocephalic, atraumatic Heart: regular rate & rhythm.  No murmurs/rubs/gallops Lungs: clear to auscultation bilaterally, normal respiratory effort Abdomen: soft, gravid, non-tender;  EFW: pending ultrasound Pelvic:    External: Normal external female genitalia  Cervix: deferred    Extremities: non-tender, symmetric, mild edema bilaterally.  DTRs: 2+  Neurologic: Alert & oriented x 3.    Results for orders placed or performed during the hospital encounter of 08/13/16 (from the past 24 hour(s))  Rapid HIV screen (HIV 1/2 Ab+Ag)     Status: None   Collection Time: 08/13/16  1:56 AM  Result Value Ref Range   HIV-1 P24 Antigen - HIV24 NON REACTIVE NON REACTIVE   HIV 1/2 Antibodies NON REACTIVE NON REACTIVE   Interpretation (HIV Ag Ab)      A non reactive test result means that HIV 1 or HIV 2 antibodies and HIV 1 p24 antigen were not detected in the specimen.  Chlamydia/NGC rt PCR ([facility] only)     Status: None   Collection Time: 08/13/16  1:56 AM  Result Value Ref Range   Specimen source GC/Chlam VAGINA    Chlamydia Tr NOT DETECTED NOT DETECTED   N gonorrhoeae NOT DETECTED NOT DETECTED  CBC     Status: Abnormal   Collection Time: 08/13/16  1:56 AM  Result Value Ref Range   WBC 14.2 (H) 3.6 - 11.0 K/uL   RBC 3.64 (L) 3.80 - 5.20 MIL/uL   Hemoglobin 11.3 (L) 12.0 - 16.0 g/dL   HCT 29.532.9 (L) 62.135.0 - 30.847.0 %   MCV  90.3 80.0 - 100.0 fL   MCH 31.0 26.0 - 34.0 pg   MCHC 34.4 32.0 - 36.0 g/dL   RDW 54.0 98.1 - 19.1 %   Platelets 286 150 - 440 K/uL  Type and screen [facility]     Status: None   Collection Time: 08/13/16  1:56 AM  Result Value Ref Range   ABO/RH(D) O POS    Antibody Screen NEG    Sample Expiration 08/16/2016   Comprehensive metabolic panel     Status: Abnormal   Collection Time: 08/13/16  1:56 AM  Result Value Ref Range   Sodium 138 135 - 145 mmol/L   Potassium 3.7 3.5 - 5.1 mmol/L   Chloride 107 101 - 111 mmol/L   CO2 23 22 - 32 mmol/L   Glucose, Bld 88 65 - 99 mg/dL   BUN 6 6 - 20 mg/dL   Creatinine, Ser 4.78 (L) 0.44 - 1.00 mg/dL   Calcium 9.5 8.9 - 29.5 mg/dL   Total Protein 6.4 (L) 6.5 - 8.1 g/dL   Albumin 3.1 (L) 3.5 - 5.0 g/dL   AST 30 15 - 41 U/L    ALT 16 14 - 54 U/L   Alkaline Phosphatase 130 (H) 38 - 126 U/L   Total Bilirubin 0.4 0.3 - 1.2 mg/dL   GFR calc non Af Amer >60 >60 mL/min   GFR calc Af Amer >60 >60 mL/min   Anion gap 8 5 - 15    Pertinent Results:  Prenatal Labs: Blood type/Rh O+  Antibody screen neg  Rubella Immune  Varicella Immune  RPR NR  HBsAg Neg  HIV NR  GC neg  Chlamydia neg  Genetic screening declined  1 hour GTT 122  3 hour GTT   GBS Not previously collected; pending  Neg pap  FHT: 105 mod + accels no decels TOCO: rare SVE: deferred     Assessment:  Kaitlyn Russell is a 31 y.o. G26P0000 female at [redacted]w[redacted]d with PPROM.   Plan:  1. Admit to Labor & Delivery 2. CBC, CMP, T&S, IVF @ 125 3. GBS, GCCT collected  4. Consents obtained. 5. Continuous efm/toco 6. Ultrasound for fetal position, growth for EFW, AFI 7. Ampicillin 2g IV and Erythromycin 250mg  IV, each q6h x 48hrs then ordinarily would give 500mg  Amoxicillin q6h x 5 days and 333mg  q8h or 250mg  q6h of PO erythromycin x 5 days.  Given she is 33.6 today will likely induce at 34.0 weeks thus full treatment will not be necessary. 8. Give Betamethasone, in spite of patient allergy to prednisone.  Will treat any adverse reactions and if they occur will not give 2nd dose. 9. Category 2 tracing:  Bradycardic, stable, and with moderate variability and accelerations.  Oxygen delivery and adequate placental perfusion currently acceptable and unlikely fetal acidemia at this time.  10. NICU consult placed; called and spoke to provider. 11. Paged [facility] for confirmation of IOL at 49 weeks   ----- Ranae Plumber, MD Attending Obstetrician and Gynecologist [facility], Department of OB/GYN [facility]

## 2016-08-13 NOTE — OB Triage Note (Signed)
Patient came in for observation for SROM at home at 2330. Patient denies bleeding and no other complaints at this time. Patient denies uterine contractions. Patient states she has been feeling the baby move fine.  Vital signs stable and patient afebrile. FHR baseline 115 with moderate variability with accelerations 15 x 15 and no decelerations. Mother at bedside with patient. Will continue to monitor.

## 2016-08-14 ENCOUNTER — Inpatient Hospital Stay: Payer: Medicaid Other | Admitting: Anesthesiology

## 2016-08-14 LAB — RPR: RPR Ser Ql: NONREACTIVE

## 2016-08-14 LAB — CBC
HCT: 31.6 % — ABNORMAL LOW (ref 35.0–47.0)
Hemoglobin: 10.8 g/dL — ABNORMAL LOW (ref 12.0–16.0)
MCH: 31.1 pg (ref 26.0–34.0)
MCHC: 34.2 g/dL (ref 32.0–36.0)
MCV: 91 fL (ref 80.0–100.0)
PLATELETS: 267 10*3/uL (ref 150–440)
RBC: 3.47 MIL/uL — AB (ref 3.80–5.20)
RDW: 13.2 % (ref 11.5–14.5)
WBC: 15.5 10*3/uL — ABNORMAL HIGH (ref 3.6–11.0)

## 2016-08-14 MED ORDER — EPHEDRINE 5 MG/ML INJ
10.0000 mg | INTRAVENOUS | Status: DC | PRN
  Filled 2016-08-14: qty 2

## 2016-08-14 MED ORDER — FENTANYL 2.5 MCG/ML W/ROPIVACAINE 0.15% IN NS 100 ML EPIDURAL (ARMC)
12.0000 mL/h | EPIDURAL | Status: DC
  Administered 2016-08-14 – 2016-08-15 (×2): 12 mL/h via EPIDURAL
  Filled 2016-08-14: qty 100

## 2016-08-14 MED ORDER — LIDOCAINE HCL (PF) 1 % IJ SOLN
INTRAMUSCULAR | Status: DC | PRN
  Administered 2016-08-14: .8 mL via SUBCUTANEOUS

## 2016-08-14 MED ORDER — DIPHENHYDRAMINE HCL 50 MG/ML IJ SOLN
12.5000 mg | INTRAMUSCULAR | Status: DC | PRN
  Administered 2016-08-15: 12.5 mg via INTRAVENOUS
  Filled 2016-08-14: qty 1

## 2016-08-14 MED ORDER — FENTANYL 2.5 MCG/ML W/ROPIVACAINE 0.15% IN NS 100 ML EPIDURAL (ARMC)
EPIDURAL | Status: AC
  Filled 2016-08-14: qty 100

## 2016-08-14 MED ORDER — LIDOCAINE-EPINEPHRINE (PF) 1.5 %-1:200000 IJ SOLN
INTRAMUSCULAR | Status: DC | PRN
  Administered 2016-08-14: 3 mL via EPIDURAL

## 2016-08-14 MED ORDER — PHENYLEPHRINE 40 MCG/ML (10ML) SYRINGE FOR IV PUSH (FOR BLOOD PRESSURE SUPPORT)
80.0000 ug | PREFILLED_SYRINGE | INTRAVENOUS | Status: DC | PRN
  Filled 2016-08-14: qty 5

## 2016-08-14 MED ORDER — FENTANYL 2.5 MCG/ML W/ROPIVACAINE 0.15% IN NS 100 ML EPIDURAL (ARMC)
EPIDURAL | Status: DC | PRN
  Administered 2016-08-14: 12 mL/h via EPIDURAL

## 2016-08-14 MED ORDER — BUTORPHANOL TARTRATE 2 MG/ML IJ SOLN
1.0000 mg | INTRAMUSCULAR | Status: DC | PRN
  Administered 2016-08-14: 1 mg via INTRAVENOUS
  Filled 2016-08-14: qty 2

## 2016-08-14 MED ORDER — LACTATED RINGERS IV SOLN
500.0000 mL | Freq: Once | INTRAVENOUS | Status: AC
  Administered 2016-08-14: 500 mL via INTRAVENOUS

## 2016-08-14 NOTE — Anesthesia Procedure Notes (Signed)
Epidural Patient location during procedure: OB Start time: 08/14/2016 9:56 PM End time: 08/14/2016 10:11 PM  Staffing Performed: anesthesiologist   Preanesthetic Checklist Completed: patient identified, site marked, surgical consent, pre-op evaluation, timeout performed, IV checked, risks and benefits discussed and monitors and equipment checked  Epidural Patient position: sitting Prep: Betadine Patient monitoring: heart rate, continuous pulse ox and blood pressure Approach: midline Location: L4-L5 Injection technique: LOR saline  Needle:  Needle type: Tuohy  Needle gauge: 17 G Needle length: 9 cm and 9 Needle insertion depth: 7 cm Catheter type: closed end flexible Catheter size: 20 Guage Catheter at skin depth: 9 cm Test dose: negative and 1.5% lidocaine with Epi 1:200 K  Assessment Events: blood not aspirated, injection not painful, no injection resistance, negative IV test and no paresthesia  Additional Notes   Patient tolerated the insertion well without complications.  After epidural was placed, no medication bag for dosing or infusion was available. Waited for dosing.Reason for block:procedure for pain

## 2016-08-14 NOTE — Progress Notes (Signed)
Pt sitting up for epidural during this half hour. Monitor tracing maternal vs fetal heart tones at times while patient sitting up.

## 2016-08-14 NOTE — Anesthesia Preprocedure Evaluation (Signed)
Anesthesia Evaluation  Patient identified by MRN, date of birth, ID band Patient awake    Reviewed: Allergy & Precautions, NPO status , Patient's Chart, lab work & pertinent test results  Airway Mallampati: II       Dental   Pulmonary asthma (no inhalers for a few months) , Current Smoker,           Cardiovascular negative cardio ROS       Neuro/Psych    GI/Hepatic negative GI ROS, Neg liver ROS,   Endo/Other  negative endocrine ROS  Renal/GU negative Renal ROS     Musculoskeletal   Abdominal   Peds  Hematology negative hematology ROS (+)   Anesthesia Other Findings   Reproductive/Obstetrics                             Anesthesia Physical Anesthesia Plan  ASA: II  Anesthesia Plan: Epidural   Post-op Pain Management:    Induction:   PONV Risk Score and Plan:   Airway Management Planned:   Additional Equipment:   Intra-op Plan:   Post-operative Plan:   Informed Consent: I have reviewed the patients History and Physical, chart, labs and discussed the procedure including the risks, benefits and alternatives for the proposed anesthesia with the patient or authorized representative who has indicated his/her understanding and acceptance.     Plan Discussed with:   Anesthesia Plan Comments:         Anesthesia Quick Evaluation

## 2016-08-14 NOTE — Progress Notes (Signed)
Difficulty continuously monitoring fetal heart tones due to fetal size and movement and maternal movement. RN to adjust u/s.

## 2016-08-14 NOTE — Progress Notes (Signed)
Tried to assess patient's pain. Pt refused to rate pain on a 0-10 scale. She stated "you can't rate pain on a scale, it just hurts!" Documented under pain assessment in flowsheet. Will continue to monitor.

## 2016-08-14 NOTE — Progress Notes (Signed)
Concerns voiced from staff concerning the demeanor of the patient.  The nursery staff stated that she had threatened to bite someone if the baby is stuck more than once if an IV has to be initiated.  She has stated that she feels the staff "don't know what they are doing".  She refused to allow younger, more inexperienced staff care for her.   Spent time, at length, at bedside.  Reviewed plan of care in detail and answered questions.  Will continue to keep her informed concerning her care and her baby's care.  Responds well to patient education and rationales.

## 2016-08-14 NOTE — Progress Notes (Signed)
Went into patient's room to switch from wireless monitors to wired monitors. Pt expressing disappointment at the fact that nobody has checked her cervix, and the doctor has not been to see her. She states "this is the worst experience being at [facility] and people have failed to communicate somewhere down the line". She states "I am ready to go the fu** home. If ya'll don't get this baby out of me i'm going to start breaking sh**!" She states her contraction medicine isn't working. She would like her cervix checked. I informed the patient the risk for infection when it comes to checking her cervix with her water being broke and preterm, but let her know I would touch base with the provider and let her know something soon. Will continue to monitor.

## 2016-08-14 NOTE — Progress Notes (Signed)
Requests one of the female physicians to care for her. Spoke with Dr Dalbert GarnetBeasley concerning the plan of care.  Will confer with Dr Feliberto GottronSchermerhorn.

## 2016-08-14 NOTE — Progress Notes (Signed)
Spoke with Dr Elesa MassedWard on phone.  Will maintain care for the patient today.  Report given and orders received.

## 2016-08-15 DIAGNOSIS — R4589 Other symptoms and signs involving emotional state: Secondary | ICD-10-CM | POA: Diagnosis present

## 2016-08-15 DIAGNOSIS — O368390 Maternal care for abnormalities of the fetal heart rate or rhythm, unspecified trimester, not applicable or unspecified: Secondary | ICD-10-CM | POA: Diagnosis present

## 2016-08-15 DIAGNOSIS — O42919 Preterm premature rupture of membranes, unspecified as to length of time between rupture and onset of labor, unspecified trimester: Secondary | ICD-10-CM | POA: Diagnosis present

## 2016-08-15 DIAGNOSIS — O99335 Smoking (tobacco) complicating the puerperium: Secondary | ICD-10-CM | POA: Diagnosis present

## 2016-08-15 DIAGNOSIS — F419 Anxiety disorder, unspecified: Secondary | ICD-10-CM | POA: Diagnosis present

## 2016-08-15 LAB — CULTURE, BETA STREP (GROUP B ONLY)

## 2016-08-15 MED ORDER — HYDROCORTISONE 2.5 % RE CREA
TOPICAL_CREAM | RECTAL | Status: DC | PRN
  Administered 2016-08-16: 14:00:00 via RECTAL
  Filled 2016-08-15 (×2): qty 28.35

## 2016-08-15 MED ORDER — OXYTOCIN 40 UNITS IN LACTATED RINGERS INFUSION - SIMPLE MED
INTRAVENOUS | Status: AC
  Administered 2016-08-15: 2 m[IU]/min via INTRAVENOUS
  Filled 2016-08-15: qty 1000

## 2016-08-15 MED ORDER — PRENATAL MULTIVITAMIN CH
1.0000 | ORAL_TABLET | Freq: Every day | ORAL | Status: DC
  Administered 2016-08-15 – 2016-08-16 (×2): 1 via ORAL
  Filled 2016-08-15 (×2): qty 1

## 2016-08-15 MED ORDER — IBUPROFEN 600 MG PO TABS
600.0000 mg | ORAL_TABLET | Freq: Four times a day (QID) | ORAL | Status: DC
  Administered 2016-08-15 – 2016-08-16 (×4): 600 mg via ORAL
  Filled 2016-08-15 (×4): qty 1

## 2016-08-15 MED ORDER — BENZOCAINE-MENTHOL 20-0.5 % EX AERO
1.0000 "application " | INHALATION_SPRAY | CUTANEOUS | Status: DC | PRN
  Filled 2016-08-15: qty 56

## 2016-08-15 MED ORDER — SIMETHICONE 80 MG PO CHEW: 80.0000 mg | CHEWABLE_TABLET | ORAL | Status: DC | PRN

## 2016-08-15 MED ORDER — AMMONIA AROMATIC IN INHA
RESPIRATORY_TRACT | Status: AC
  Filled 2016-08-15: qty 10

## 2016-08-15 MED ORDER — TERBUTALINE SULFATE 1 MG/ML IJ SOLN: 0.2500 mg | Freq: Once | INTRAMUSCULAR | Status: DC | PRN

## 2016-08-15 MED ORDER — DOCUSATE SODIUM 100 MG PO CAPS
100.0000 mg | ORAL_CAPSULE | Freq: Two times a day (BID) | ORAL | Status: DC
  Administered 2016-08-15 – 2016-08-16 (×2): 100 mg via ORAL
  Filled 2016-08-15 (×2): qty 1

## 2016-08-15 MED ORDER — MISOPROSTOL 200 MCG PO TABS
ORAL_TABLET | ORAL | Status: AC
  Filled 2016-08-15: qty 4

## 2016-08-15 MED ORDER — ACETAMINOPHEN 500 MG PO TABS: 1000.0000 mg | ORAL_TABLET | Freq: Four times a day (QID) | ORAL | Status: DC | PRN

## 2016-08-15 MED ORDER — OXYTOCIN 40 UNITS IN LACTATED RINGERS INFUSION - SIMPLE MED
1.0000 m[IU]/min | INTRAVENOUS | Status: DC
  Administered 2016-08-15: 2 m[IU]/min via INTRAVENOUS
  Filled 2016-08-15: qty 1000

## 2016-08-15 MED ORDER — LIDOCAINE HCL (PF) 1 % IJ SOLN
INTRAMUSCULAR | Status: AC
  Filled 2016-08-15: qty 30

## 2016-08-15 MED ORDER — LIDOCAINE 5 % EX PTCH
MEDICATED_PATCH | CUTANEOUS | Status: AC
  Filled 2016-08-15: qty 1

## 2016-08-15 MED ORDER — ONDANSETRON HCL 4 MG PO TABS: 4.0000 mg | ORAL_TABLET | ORAL | Status: DC | PRN

## 2016-08-15 MED ORDER — COCONUT OIL OIL: 1.0000 "application " | TOPICAL_OIL | Status: DC | PRN

## 2016-08-15 MED ORDER — WITCH HAZEL-GLYCERIN EX PADS
1.0000 "application " | MEDICATED_PAD | CUTANEOUS | Status: DC | PRN
  Administered 2016-08-16: 1 via TOPICAL
  Filled 2016-08-15: qty 100

## 2016-08-15 MED ORDER — OXYTOCIN 10 UNIT/ML IJ SOLN
INTRAMUSCULAR | Status: AC
  Filled 2016-08-15: qty 2

## 2016-08-15 MED ORDER — ONDANSETRON HCL 4 MG/2ML IJ SOLN
4.0000 mg | INTRAMUSCULAR | Status: DC | PRN
Start: 2016-08-15 — End: 2016-08-16

## 2016-08-15 MED ORDER — DIPHENHYDRAMINE HCL 25 MG PO CAPS: 25.0000 mg | ORAL_CAPSULE | Freq: Four times a day (QID) | ORAL | Status: DC | PRN

## 2016-08-15 NOTE — Progress Notes (Signed)
Patient sleeping soundly at this time. Advised mother that we would cluster care throughout the night per the patients request to rest. Mother advised to notify RN when patient woke up and denies any further needs at this time.

## 2016-08-15 NOTE — Discharge Summary (Signed)
Obstetrical Discharge Summary  Patient Name: Kaitlyn Russell DOB: [birth year] MRN: [id number]  Date of Admission: 08/13/2016 Date of Delivery:08/15/16 Delivered by: Ranae Plumberhelsea Allura Doepke, MD Date of Discharge: 08/16/2016  [facility]   LMP: 12/23/15 EDC Estimated Date of Delivery: 09/25/16 Gestational Age at Delivery: 7151w1d   Antepartum complications:  1. Smoker  2. Anxiety 3. Resolved low-lying placenta 4. Fetal arrhythmia s/p ECHO wnl - not reproduced on exams 5. PPROM  Admitting Diagnosis:  PPROM   Secondary Diagnosis: Patient Active Problem List   Diagnosis Date Noted  . Preterm premature rupture of membranes 08/15/2016  . Labor and delivery indication for care or intervention 08/15/2016  . Fetal arrhythmia affecting pregnancy, antepartum 08/15/2016  . Anxiety 08/15/2016  . Smoking (tobacco) complicating the puerperium 08/15/2016  . SVD (spontaneous vaginal delivery) 08/15/2016  . Postpartum care following vaginal delivery 08/15/2016  . Threatening to others 08/15/2016  . Pregnancy 08/13/2016    Augmentation: Pitocin and Cytotec Complications: None Intrapartum complications/course: Mom presented to L&D with PPROM at 33.6 weeks. Was given Ampicillin, Erythromycin, and Azithromycin, Betamethasone x 2 doses.  At 34 weeks she was given cytotec for cervical ripening x 24hrs. epidual placed. She was then augmented with pitocin.  Progressed to complete, second stage: 1+hr, with delivery of fetal head with restitution to LOT.   Anterior then posterior shoulders delivered without difficulty.  Baby placed on mom's chest, and attended to by peds.  We sang happy birthday to baby Kaitlyn Russell.  Cord was then clamped and cut when pulseless. Cord blood collected. Placenta spontaneously delivered, intact.   IV pitocin given for hemorrhage prophylaxis.  Shallow 1st degree repaired in standard fashion. Date of Delivery: 08/15/16 Delivered By: Leeroy Bockhelsea Louiza Moor Delivery Type: spontaneous vaginal  delivery Anesthesia: epidural Placenta: sponatneous Laceration: 1st degree Episiotomy: none Newborn Data: Live born female  Birth Weight: 4 lb 6.2 oz (1990 g) APGAR: 8, 9  Postpartum Procedures: none  Post partum course:  Patient had an uncomplicated postpartum course.  By time of discharge on PPD#1, her pain was controlled on oral pain medications; she had appropriate lochia and was ambulating, voiding without difficulty and tolerating regular diet.  She was deemed stable for discharge to home.    Discharge Physical Exam:  BP 96/63 (BP Location: Left Arm)   Pulse 66   Temp 98.2 F (36.8 C) (Oral)   Resp 18   Ht 5\' 3"  (1.6 m)   Wt 68 kg (150 lb)   LMP  (Exact Date)   SpO2 99%   Breastfeeding? Unknown   BMI 26.57 kg/m   General: NAD CV: RRR Pulm: CTABL, nl effort ABD: s/nd/nt, fundus firm and below the umbilicus Lochia: moderate DVT Evaluation: LE non-ttp, no evidence of DVT on exam.  Hemoglobin  Date Value Ref Range Status  08/16/2016 11.1 (L) 12.0 - 16.0 g/dL Final   HGB  Date Value Ref Range Status  03/21/2014 12.9 12.0 - 16.0 g/dL Final   HCT  Date Value Ref Range Status  08/16/2016 32.3 (L) 35.0 - 47.0 % Final  03/21/2014 38.7 35.0 - 47.0 % Final     Disposition: stable, discharge to home. Baby Feeding: formula Baby Disposition: NICU for uncertain duration  Rh Immune globulin given: n/a Rubella vaccine given: n/a Tdap vaccine given in AP or PP setting: declined Flu vaccine given in AP or PP setting: n/a  Contraception: none desired  Prenatal Labs:  Blood type/Rh O+  Antibody screen neg  Rubella Immune  Varicella Immune  RPR  NR  HBsAg Neg  HIV NR  GC neg  Chlamydia neg  Genetic screening declined  1 hour GTT 122  3 hour GTT   GBS Pending - treated with multiple doses of ampicillin No GBS isolated on final result   Neg pap    Plan:  Kaitlyn Russell was discharged to home in good condition. [facility]-up appointment at [facility]  OB/GYN with Dr. Elesa Massed in 6 weeks    Discharge Medications: Allergies as of 08/16/2016      Reactions   Prednisone    Latex Rash      Medication List    TAKE these medications   MULTIVITAMIN/IRON PO Take 1 tablet by mouth daily.   ondansetron 4 MG disintegrating tablet Commonly known as:  ZOFRAN ODT Take 1 tablet (4 mg total) by mouth every 8 (eight) hours as needed for nausea or vomiting.         Signed: ----- Ranae Plumber, MD Attending Obstetrician and Gynecologist [facility], Department of OB/GYN [facility]

## 2016-08-15 NOTE — Progress Notes (Signed)
Patient arrived on mother/baby unit and requesting/demanding to go downstairs to smoke. MD notified and order given to allow patient to go downstairs. Safety concerns reviewed with patient and explained to patient that riding in a wheelchair each time would be safer than ambulating. Patient verbalized understanding however patient stated after she got her IV out she would want to walk. Again, I explained to patient that we would recommend her going down in a wheelchair however she said she did not want too.   Oswald HillockAbigail Garner, RN

## 2016-08-16 LAB — CBC
HEMATOCRIT: 32.3 % — AB (ref 35.0–47.0)
Hemoglobin: 11.1 g/dL — ABNORMAL LOW (ref 12.0–16.0)
MCH: 31.2 pg (ref 26.0–34.0)
MCHC: 34.3 g/dL (ref 32.0–36.0)
MCV: 90.8 fL (ref 80.0–100.0)
PLATELETS: 271 10*3/uL (ref 150–440)
RBC: 3.56 MIL/uL — ABNORMAL LOW (ref 3.80–5.20)
RDW: 13.4 % (ref 11.5–14.5)
WBC: 15.9 10*3/uL — AB (ref 3.6–11.0)

## 2016-08-16 MED ORDER — IBUPROFEN 600 MG PO TABS
600.0000 mg | ORAL_TABLET | Freq: Four times a day (QID) | ORAL | Status: DC
  Administered 2016-08-16: 600 mg via ORAL
  Filled 2016-08-16: qty 1

## 2016-08-16 NOTE — Anesthesia Postprocedure Evaluation (Signed)
Anesthesia Post Note  Patient: Kaitlyn Russell  Procedure(s) Performed: * No procedures listed *  Patient location during evaluation: Mother Baby Anesthesia Type: Epidural Level of consciousness: awake and alert Pain management: pain level controlled Vital Signs Assessment: post-procedure vital signs reviewed and stable Respiratory status: spontaneous breathing, nonlabored ventilation and respiratory function stable Cardiovascular status: stable Postop Assessment: no headache, no backache and patient able to bend at knees Anesthetic complications: no     Last Vitals:  Vitals:   08/16/16 0407 08/16/16 0727  BP: 99/61 (!) 95/59  Pulse: 68 (!) 55  Resp: 18 18  Temp: 36.8 C 36.6 C    Last Pain:  Vitals:   08/16/16 0810  TempSrc:   PainSc: 6                  Cleda MccreedyJoseph K Sael Furches

## 2016-08-16 NOTE — Clinical Social Work Maternal (Signed)
CLINICAL SOCIAL WORK MATERNAL/CHILD NOTE  Patient Details  Name: Kaitlyn Russell MRN: [id number] Date of Birth: 10/02/1985  Date:  08/16/2016  Clinical Social Worker Initiating Note:  Laurene Melendrez Martha Ashya Nicolaisen, MSW, LCSWA      Date/ Time Initiated:  08/16/16/1421           Child's Name:  Jameson Nissen   Legal Guardian:  Mother   Need for Interpreter:  None   Date of Referral:  08/16/16     Reason for Referral:  Parental Support of Premature Babies < 32 weeks/or Critically Ill babies    Referral Source:  NICU   Address:  [address], [place], [place] [address]  Phone number:  [phone]   Household Members: Self   Natural Supports (not living in the home): Parent, Immediate Family   Professional Supports:None   Employment:Part-time   Type of Work:     Education:  High school graduate   Financial Resources:Medicaid   Other Resources: WIC   Cultural/Religious Considerations Which May Impact Care: None reported  Strengths: Ability to meet basic needs , Compliance with medical plan , Home prepared for child , Pediatrician chosen , Understanding of illness   Risk Factors/Current Problems: None   Cognitive State: Alert , Linear Thinking , Goal Oriented , Other (Comment)   Mood/Affect: Irritable    CSW Assessment:The CSW met with the MOB and maternal grandmother at bedside to introduce self and role of CSW in care for children in the SC[place]. The CSW provided psychoeducation regarding post-partum depression and post-partum anxiety including signs and symptoms. The MOB indicated that she is not worried about such.  The maternal grandmother indicated that she would be supportive with babysitting if/when the MOB is working. The FOB is not involved with the child or MOB. The MOB is aware that she needs a preemie appropriate car seat, and she is in the process of gaining one. The MOB reported that she has no questions, and she further  reported that she does not plan to breastfeed.  The MOB and maternal grandmother both presented with flat/irratible affect during the entire session; however, neither used profanity at any time. CSW will continue to follow for ongoing care until Jameson discharges from the [facility].  CSW Plan/Description: Patient/Family Education     Norva Bowe M [place], LCSW 08/16/2016, 2:24 PM    CLINICAL SOCIAL WORK MATERNAL/CHILD NOTE  Patient Details  Name: Kaitlyn Russell MRN: [id number] Date of Birth: [birth year]  Date:  08/16/2016  Clinical Social Worker Initiating Note:  Santiago Bumpers, MSW, [place] Date/ Time Initiated:  08/16/16/1421     Child's Name:  Ilda Basset   Legal Guardian:  Mother   Need for Interpreter:  None   Date of Referral:  08/16/16     Reason for Referral:  Parental Support of Premature Babies < 32 weeks/or Critically Ill babies    Referral Source:  NICU   Address:  [address], [place], [place] [address]  Phone number:  [phone]   Household Members:  Self   Natural Supports (not living in the home):  Parent, Immediate Family   Professional Supports: None   Employment: Part-time   Type of Work:     Education:  Database administrator Resources:  Medicaid   Other Resources:  [facility]   Cultural/Religious Considerations Which May Impact Care:  None reported  Strengths:  Ability to meet basic needs , Compliance with medical plan , Home prepared for child , Pediatrician chosen , Understanding of illness   Risk Factors/Current Problems:  None   Cognitive State:  Alert , Linear Thinking , Goal Oriented , Other (Comment)   Mood/Affect:  Irritable    CSW Assessment: The CSW met with the MOB and maternal grandmother at bedside to introduce self and role of CSW in care for children in the SCN. The CSW provided psychoeducation regarding post-partum depression and post-partum anxiety including signs and symptoms. The MOB indicated that she is not worried about such.  The maternal grandmother indicated that she would be supportive with babysitting if/when the MOB is working. The FOB is not involved with the child or MOB. The MOB is aware that she needs a preemie appropriate car seat, and she is in the process of gaining one. The MOB reported that she has no questions, and she further reported that she does not plan to  breastfeed.  The MOB and maternal grandmother both presented with flat/irratible affect during the entire session; however, neither used profanity at any time. CSW will continue to follow for ongoing care until [place] discharges from the [facility].  CSW Plan/Description:  Patient/Family Education     Zettie Pho, LCSW 08/16/2016, 2:24 PM

## 2016-08-16 NOTE — Progress Notes (Signed)
D/C instructions provided, pt states understanding, aware of follow up appt.      

## 2016-08-16 NOTE — Discharge Instructions (Signed)

## 2016-08-16 NOTE — Progress Notes (Signed)
Stopped by twice to see patient, also went to NICU to see if she was there -  She was not in room or NICU either time.  Will return to round on patient when I am out of the OR.  ----- Ranae Plumberhelsea Opal Mckellips, MD Attending Obstetrician and Gynecologist [facility], Department of OB/GYN [facility]

## 2016-08-18 LAB — SURGICAL PATHOLOGY

## 2018-08-05 ENCOUNTER — Telehealth: Payer: Self-pay | Admitting: Physician Assistant

## 2018-08-05 DIAGNOSIS — J4521 Mild intermittent asthma with (acute) exacerbation: Secondary | ICD-10-CM

## 2018-08-05 MED ORDER — ALBUTEROL SULFATE HFA 108 (90 BASE) MCG/ACT IN AERS: 2.0000 | INHALATION_SPRAY | Freq: Four times a day (QID) | RESPIRATORY_TRACT | 0 refills | Status: DC | PRN

## 2018-08-05 NOTE — Progress Notes (Signed)
Message sent to patient requesting further input regarding current symptoms. Awaiting patient response.  

## 2018-08-05 NOTE — Progress Notes (Signed)
E Visit for Asthma  Based on what you have shared with me, it looks like you may have a flare up of your asthma.  Asthma is a chronic (ongoing) lung disease which results in airway obstruction, inflammation and hyper-responsiveness.   Asthma symptoms vary from person to person, with common symptoms including nighttime awakening and decreased ability to participate in normal activities as a result of shortness of breath. It is often triggered by changes in weather, changes in the season, changes in air temperature, or inside (home, school, daycare or work) allergens such as animal dander, mold, mildew, woodstoves or cockroaches.   It can also be triggered by hormonal changes, extreme emotion, physical exertion or an upper respiratory tract illness.     It is important to identify the trigger, and then eliminate or avoid the trigger if possible.   If you have been prescribed medications to be taken on a regular basis, it is important to follow the asthma action plan and to follow guidelines to adjust medication in response to increasing symptoms of decreased peak expiratory flow rate  Treatment: I have prescribed: Albuterol (Proventil HFA; Ventolin HFA) 108 (90 Base) MCG/ACT Inhaler 2 puffs into the lungs every six hours as needed for wheezing or shortness of breath  HOME CARE . Only take medications as instructed by your medical team. . Consider wearing a mask or scarf to improve breathing air temperature have been shown to decrease irritation and decrease exacerbations . Get rest. . Taking a steamy shower or using a humidifier may help nasal congestion sand ease sore throat pain. You can place a towel over your head and breathe in the steam from hot water coming from a faucet. . Using a saline nasal spray works much the same way.  . Cough drops, hare candies and sore throat lozenges may  ease your cough.  . Avoid close contacts especially the very you and the elderly . Cover your mouth if you cough or sneeze . Always remember to wash your hands.    GET HELP RIGHT AWAY IF: . You develop worsening symptoms; breathlessness at rest, drowsy, confused or agitated, unable to speak in full sentences . You have coughing fits . You develop a severe headache or visual changes . You develop shortness of breath, difficulty breathing or start having chest pain . Your symptoms persist after you have completed your treatment plan . If your symptoms do not improve within 10 days  MAKE SURE YOU . Understand these instructions. . Will watch your condition. . Will get help right away if you are not doing well or get worse.   Your e-visit answers were reviewed by a board certified advanced clinical practitioner to complete your personal care plan, Depending upon the condition, your plan could have included both over the counter or prescription medications.  Please review your pharmacy choice. Your safety is important to us. If you have drug allergies check your prescription carefully. You can use MyChart to ask questions about today's visit, request a non-urgent call back, or ask for a work or school excuse for 24 hours related to this e-Visit. If it has been greater than 24 hours you will need to follow up with your provider, or enter a new e-Visit to address those concerns.  You will get an e-mail in the next two days asking about your experience. I hope that your e-visit has been valuable and will speed your recovery. Thank you for using e-visits. 

## 2018-08-05 NOTE — Progress Notes (Signed)
I have spent 5 minutes in review of e-visit questionnaire, review and updating patient chart, medical decision making and response to patient.   Danen Lapaglia Cody Ajee Heasley, PA-C    

## 2018-08-12 ENCOUNTER — Telehealth: Payer: Self-pay | Admitting: *Deleted

## 2018-08-12 ENCOUNTER — Encounter: Payer: Self-pay | Admitting: Emergency Medicine

## 2018-08-12 ENCOUNTER — Other Ambulatory Visit: Payer: Self-pay

## 2018-08-12 ENCOUNTER — Ambulatory Visit
Admission: EM | Admit: 2018-08-12 | Discharge: 2018-08-12 | Disposition: A | Discharge status: Home / Self Care | Payer: HRSA Program | Attending: Family Medicine | Admitting: Family Medicine

## 2018-08-12 DIAGNOSIS — R21 Rash and other nonspecific skin eruption: Secondary | ICD-10-CM | POA: Diagnosis not present

## 2018-08-12 DIAGNOSIS — R05 Cough: Secondary | ICD-10-CM | POA: Diagnosis not present

## 2018-08-12 DIAGNOSIS — R059 Cough, unspecified: Secondary | ICD-10-CM

## 2018-08-12 DIAGNOSIS — Z20828 Contact with and (suspected) exposure to other viral communicable diseases: Secondary | ICD-10-CM

## 2018-08-12 DIAGNOSIS — Z20822 Contact with and (suspected) exposure to covid-19: Secondary | ICD-10-CM

## 2018-08-12 DIAGNOSIS — R0602 Shortness of breath: Secondary | ICD-10-CM | POA: Diagnosis not present

## 2018-08-12 MED ORDER — BENZONATATE 200 MG PO CAPS: 200.0000 mg | ORAL_CAPSULE | Freq: Three times a day (TID) | ORAL | 0 refills | Status: DC | PRN

## 2018-08-12 MED ORDER — ALBUTEROL SULFATE HFA 108 (90 BASE) MCG/ACT IN AERS: 1.0000 | INHALATION_SPRAY | Freq: Four times a day (QID) | RESPIRATORY_TRACT | 0 refills | Status: DC | PRN

## 2018-08-12 MED ORDER — TRIAMCINOLONE ACETONIDE 0.1 % EX OINT: 1.0000 "application " | TOPICAL_OINTMENT | Freq: Two times a day (BID) | CUTANEOUS | 0 refills | Status: DC

## 2018-08-12 NOTE — ED Provider Notes (Signed)
[facility]    CSN: [id number] Arrival date & time: 08/12/18  1241  History   Chief Complaint Chief Complaint  Patient presents with  . Cough  . Shortness of Breath   HPI   33 year old female presents with the above complaints.  Patient reports that she is concerned that she has COVID-19.  She reports cough, runny nose, chest tightness, and "heavy breathing".  No fever.  She states that she has had coworkers that have recently tested positive.  Patient also states that she has a rash on her lower extremities.  No medications or interventions tried.  No known exacerbating or relieving factors.  Patient desires COVID testing today.  No other associated symptoms.  No other complaints.  Hx reviewed as below. Past Medical History:  Diagnosis Date  . Asthma    Patient Active Problem List   Diagnosis Date Noted  . Preterm premature rupture of membranes 08/15/2016  . Labor and delivery indication for care or intervention 08/15/2016  . Fetal arrhythmia affecting pregnancy, antepartum 08/15/2016  . Anxiety 08/15/2016  . Smoking (tobacco) complicating the puerperium 08/15/2016  . SVD (spontaneous vaginal delivery) 08/15/2016  . Postpartum care following vaginal delivery 08/15/2016  . Threatening to others 08/15/2016  . Pregnancy 08/13/2016   OB History    Gravida  1   Para  1   Term  0   Preterm  1   AB  0   Living  1     SAB  0   TAB  0   Ectopic  0   Multiple  0   Live Births  1          Home Medications    Prior to Admission medications   Medication Sig Start Date End Date Taking? Authorizing Provider  albuterol (VENTOLIN HFA) 108 (90 Base) MCG/ACT inhaler Inhale 1-2 puffs into the lungs every 6 (six) hours as needed for wheezing or shortness of breath. 08/12/18   Tommie Samsook, Jamai Dolce G, DO  benzonatate (TESSALON) 200 MG capsule Take 1 capsule (200 mg total) by mouth 3 (three) times daily as needed for cough. 08/12/18   Tommie Samsook, Dafne Nield G, DO  Multiple  Vitamins-Iron (MULTIVITAMIN/IRON PO) Take 1 tablet by mouth daily.    [provider]  ondansetron (ZOFRAN ODT) 4 MG disintegrating tablet Take 1 tablet (4 mg total) by mouth every 8 (eight) hours as needed for nausea or vomiting. Patient not taking: Reported on 08/13/2016 04/06/16   Emily FilbertWilliams, Jonathan E, MD  triamcinolone ointment (KENALOG) 0.1 % Apply 1 application topically 2 (two) times daily. 08/12/18   Tommie Samsook, Kilan Banfill G, DO   Social History Social History   Tobacco Use  . Smoking status: Former Smoker    Types: Cigarettes  . Smokeless tobacco: Never Used  Substance Use Topics  . Alcohol use: No  . Drug use: No     Allergies   Prednisone and Latex   Review of Systems Review of Systems  Constitutional: Negative for fever.  HENT: Positive for rhinorrhea.   Respiratory: Positive for cough and chest tightness.   Skin: Positive for rash.   Physical Exam Triage Vital Signs ED Triage Vitals  Enc Vitals Group     BP 08/12/18 1256 117/84     Pulse Rate 08/12/18 1256 71     Resp 08/12/18 1256 16     Temp 08/12/18 1256 98.6 F (37 C)     Temp Source 08/12/18 1256 Oral     SpO2 08/12/18 1256 98 %  Weight 08/12/18 1253 135 lb (61.2 kg)     Height 08/12/18 1253 5\' 2"  (1.575 m)     Head Circumference --      Peak Flow --      Pain Score 08/12/18 1252 2     Pain Loc --      Pain Edu? --      Excl. in [place]? --    Updated Vital Signs BP 117/84 (BP Location: Right Arm)   Pulse 71   Temp 98.6 F (37 C) (Oral)   Resp 16   Ht 5\' 2"  (1.575 m)   Wt 61.2 kg   LMP 08/05/2018 (Approximate)   SpO2 98%   Breastfeeding No   BMI 24.69 kg/m   Visual Acuity Right Eye Distance:   Left Eye Distance:   Bilateral Distance:    Right Eye Near:   Left Eye Near:    Bilateral Near:     Physical Exam Vitals signs and nursing note reviewed.  Constitutional:      General: She is not in acute distress.    Appearance: Normal appearance.  HENT:     Head: Normocephalic and  atraumatic.  Eyes:     General:        Right eye: No discharge.        Left eye: No discharge.     Conjunctiva/sclera: Conjunctivae normal.  Cardiovascular:     Rate and Rhythm: Normal rate and regular rhythm.  Pulmonary:     Effort: Pulmonary effort is normal.     Breath sounds: Normal breath sounds. No wheezing, rhonchi or rales.  Skin:    Comments: Scattered areas of erythema located on the lower extremities.  Neurological:     Mental Status: She is alert.  Psychiatric:        Mood and Affect: Mood normal.        Behavior: Behavior normal.    UC Treatments / Results  Labs (all labs ordered are listed, but only abnormal results are displayed) Labs Reviewed - No data to display  EKG None  Radiology No results found.  Procedures Procedures (including critical care time)  Medications Ordered in UC Medications - No data to display  Initial Impression / Assessment and Plan / UC Course  I have reviewed the triage vital signs and the nursing notes.  Pertinent labs & imaging results that were available during my care of the patient were reviewed by me and considered in my medical decision making (see chart for details).    33 year old female presents with concern for COVID-19.  Arranging for drive-through testing.  Albuterol and Tessalon Perles as needed.  Regarding her rash, this appears to be contact or allergic in nature.  Treating with triamcinolone.  Final Clinical Impressions(s) / UC Diagnoses   Final diagnoses:  Exposure to Covid-19 Virus  Cough  SOB (shortness of breath)  Rash and nonspecific skin eruption     Discharge Instructions     Medications as prescribed  You will receive a call about arranging testing.  Take care  Dr. Lacinda Axon    ED Prescriptions    Medication Sig Dispense Auth. Provider   albuterol (VENTOLIN HFA) 108 (90 Base) MCG/ACT inhaler Inhale 1-2 puffs into the lungs every 6 (six) hours as needed for wheezing or shortness of breath.  18 g Ethelle Ola G, DO   benzonatate (TESSALON) 200 MG capsule Take 1 capsule (200 mg total) by mouth 3 (three) times daily as needed for cough. 30 capsule [place],  Kyley Solow G, DO   triamcinolone ointment (KENALOG) 0.1 % Apply 1 application topically 2 (two) times daily. 30 g Tommie Samsook, Abdel Effinger G, DO     Controlled Substance Prescriptions [place] Controlled Substance Registry consulted? Not Applicable   Tommie SamsCook, Sherrin Stahle G, DO 08/12/18 1841

## 2018-08-12 NOTE — ED Triage Notes (Signed)
Patient c/o cough and SOB for the past 2-3 days.  Patient denies fever.  Patient states that several of her coworkers have been positive for COVID-19.

## 2018-08-12 NOTE — Telephone Encounter (Signed)
Patient scheduled for covid testing today at the [facility]. Instructions given. Unable to place order for covid testing because it was already placed by another provider

## 2018-08-12 NOTE — Telephone Encounter (Signed)
-----   Message from Coral Spikes, DO sent at 08/12/2018  1:44 PM EDT ----- Regarding: [place] test.  Thersa Salt DO [facility]

## 2018-08-12 NOTE — Discharge Instructions (Signed)
Medications as prescribed  You will receive a call about arranging testing.  Take care  Dr. Lacinda Axon

## 2018-08-14 LAB — NOVEL CORONAVIRUS, NAA: SARS-CoV-2, NAA: NOT DETECTED

## 2018-08-16 ENCOUNTER — Telehealth (HOSPITAL_COMMUNITY): Payer: Self-pay | Admitting: Emergency Medicine

## 2018-08-16 NOTE — Telephone Encounter (Signed)
Your test for COVID-19 was negative.  Please continue good preventive care measures, including:  frequent hand-washing, avoid touching your face, cover coughs/sneezes, stay out of crowds and keep a 6 foot distance from others.  If you develop fever/cough/breathlessness, please stay home for 10 days and until you have had 3 consecutive days with cough/breathlessness improving and without fever (without taking a fever reducer). Go to the nearest hospital ED tent for assessment if fever/cough/breathlessness are severe or illness seems like a threat to life.   Attempted to reach patient. No answer at this time. Voicemail left.   

## 2018-08-16 NOTE — Telephone Encounter (Signed)
Patient contacted and made aware of all results, all questions answered. Sent letter about neg covid test to Patients Mychart

## 2018-12-03 ENCOUNTER — Other Ambulatory Visit: Payer: Self-pay

## 2018-12-03 ENCOUNTER — Ambulatory Visit
Admission: EM | Admit: 2018-12-03 | Discharge: 2018-12-03 | Disposition: A | Discharge status: Home / Self Care | Payer: Self-pay

## 2018-12-03 DIAGNOSIS — R197 Diarrhea, unspecified: Secondary | ICD-10-CM

## 2018-12-03 DIAGNOSIS — R200 Anesthesia of skin: Secondary | ICD-10-CM

## 2018-12-03 NOTE — ED Provider Notes (Signed)
[facility]    CSN: [id number] Arrival date & time: 12/03/18  0907      History   Chief Complaint Chief Complaint  Patient presents with  . Abdominal Cramping    HPI Kaitlyn Russell is a 33 y.o. female.   Kaitlyn Russell presents with complaints of abdominal pain and diarrhea. States started yesterday as epigastric pain, now more with intermittent low abdomen pain. >6 episodes of loose and watery stools yesterday. Today only 1 episode of loose stool. Took pepto bismol which helped. No nausea or vomiting. No current pain. No fevers. Denies any previous similar. States her mother was seen here two days ago with similar, she is improving. No recent travel, no recent antibiotics. Hadn't eat out or any known suspicious food intake prior to onset of symptoms. No uri symptoms. Normal urination. Did eat a danish this morning and tolerated it. Also states she wakes up with numbness sensation to 3-5th fingers, at night. Also wakes with mild pain and swelling to her feet, bilaterally. No current symptoms. She works on her feet on concrete, working with medical gowns, repetitively with arms. History  Of asthma.     ROS per HPI, negative if not otherwise mentioned.      Past Medical History:  Diagnosis Date  . Asthma     Patient Active Problem List   Diagnosis Date Noted  . Preterm premature rupture of membranes 08/15/2016  . Labor and delivery indication for care or intervention 08/15/2016  . Fetal arrhythmia affecting pregnancy, antepartum 08/15/2016  . Anxiety 08/15/2016  . Smoking (tobacco) complicating the puerperium 08/15/2016  . SVD (spontaneous vaginal delivery) 08/15/2016  . Postpartum care following vaginal delivery 08/15/2016  . Threatening to others 08/15/2016  . Pregnancy 08/13/2016    Past Surgical History:  Procedure Laterality Date  . NO PAST SURGERIES      OB History    Gravida  1   Para  1   Term  0   Preterm  1   AB  0   Living  1     SAB  0   TAB  0   Ectopic  0   Multiple  0   Live Births  1            Home Medications    Prior to Admission medications   Medication Sig Start Date End Date Taking? Authorizing Provider  albuterol (VENTOLIN HFA) 108 (90 Base) MCG/ACT inhaler Inhale 1-2 puffs into the lungs every 6 (six) hours as needed for wheezing or shortness of breath. 08/12/18  Yes Cook, Jayce G, DO  benzonatate (TESSALON) 200 MG capsule Take 1 capsule (200 mg total) by mouth 3 (three) times daily as needed for cough. 08/12/18   Tommie Sams, DO  Multiple Vitamins-Iron (MULTIVITAMIN/IRON PO) Take 1 tablet by mouth daily.    [provider]  ondansetron (ZOFRAN ODT) 4 MG disintegrating tablet Take 1 tablet (4 mg total) by mouth every 8 (eight) hours as needed for nausea or vomiting. Patient not taking: Reported on 08/13/2016 04/06/16   Emily Filbert, MD  triamcinolone ointment (KENALOG) 0.1 % Apply 1 application topically 2 (two) times daily. 08/12/18   Tommie Sams, DO    Family History History reviewed. No pertinent family history.  Social History Social History   Tobacco Use  . Smoking status: Former Smoker    Types: Cigarettes  . Smokeless tobacco: Never Used  Substance Use Topics  . Alcohol use: No  .  Drug use: No     Allergies   Prednisone and Latex   Review of Systems Review of Systems   Physical Exam Triage Vital Signs ED Triage Vitals  Enc Vitals Group     BP 12/03/18 0945 114/78     Pulse Rate 12/03/18 0945 67     Resp 12/03/18 0945 14     Temp 12/03/18 0945 98.1 F (36.7 C)     Temp Source 12/03/18 0945 Oral     SpO2 12/03/18 0945 99 %     Weight 12/03/18 0942 130 lb (59 kg)     Height 12/03/18 0942 5\' 3"  (1.6 m)     Head Circumference --      Peak Flow --      Pain Score 12/03/18 0942 3     Pain Loc --      Pain Edu? --      Excl. in GC? --    No data found.  Updated Vital Signs BP 114/78 (BP Location: Left Arm)   Pulse 67   Temp 98.1 F (36.7  C) (Oral)   Resp 14   Ht 5\' 3"  (1.6 m)   Wt 130 lb (59 kg)   LMP 11/25/2018   SpO2 99%   BMI 23.03 kg/m    Physical Exam Constitutional:      General: She is not in acute distress.    Appearance: She is well-developed.  Cardiovascular:     Rate and Rhythm: Normal rate and regular rhythm.     Heart sounds: Normal heart sounds.  Pulmonary:     Effort: Pulmonary effort is normal.     Breath sounds: Normal breath sounds.  Abdominal:     Palpations: Abdomen is soft.     Tenderness: There is abdominal tenderness in the suprapubic area and left lower quadrant. There is no guarding or rebound.     Comments: Mild LLQ and generalized lower abdominal pain on palpation   Musculoskeletal:     Left elbow: Normal.     Right wrist: Normal.     Left wrist: Normal.     Right hand: Normal.     Left hand: Normal.  Skin:    General: Skin is warm and dry.  Neurological:     Mental Status: She is alert and oriented to person, place, and time.      UC Treatments / Results  Labs (all labs ordered are listed, but only abnormal results are displayed) Labs Reviewed - No data to display  EKG   Radiology No results found.  Procedures Procedures (including critical care time)  Medications Ordered in UC Medications - No data to display  Initial Impression / Assessment and Plan / UC Course  I have reviewed the triage vital signs and the nursing notes.  Pertinent labs & imaging results that were available during my care of the patient were reviewed by me and considered in my medical decision making (see chart for details).     Likely viral diarhea source, mother also with similar symptoms, improving. Patient improving. No fevers. Tolerating PO today. Non specific abdominal pain. No red flag findings. Encouraged bland diet, fluids. Return precautions provided. Discussed cubital tunnel type source of symptoms, as well as use of compression socks while working and supportive shoes.  Encouraged follow up with pcp for persistent symptoms. Patient verbalized understanding and agreeable to plan.   Final Clinical Impressions(s) / UC Diagnoses   Final diagnoses:  Diarrhea, unspecified type  Bilateral hand numbness  Discharge Instructions     Small frequent sips of fluids- Pedialyte, Gatorade, water, broth- to maintain hydration.   Bland diet as tolerated. May use pepto bismol as needed for symptoms, do be careful of rebound constipation.  Try to sleep with elbow straight.  Wear compression stockings and shoes with supportive heal.  Please follow up with your primary care provider if your hands and feet continue to bother you.  If you develop worsening of abdominal pain, fevers, dehydration, blood in stool, or otherwise worsening please return or go to the ER.    ED Prescriptions    None     PDMP not reviewed this encounter.   Zigmund Gottron, NP 12/03/18 1045

## 2018-12-03 NOTE — ED Triage Notes (Signed)
Patient complains of abdominal pain that diarrhea started yesterday morning, reports that mother had the same symptoms. States that diarrhea seems resolved.   States that she has also noticed bilateral leg and arm swelling when she first wakes up in the morning and after working.

## 2018-12-03 NOTE — Discharge Instructions (Signed)
Small frequent sips of fluids- Pedialyte, Gatorade, water, broth- to maintain hydration.   Bland diet as tolerated. May use pepto bismol as needed for symptoms, do be careful of rebound constipation.  Try to sleep with elbow straight.  Wear compression stockings and shoes with supportive heal.  Please follow up with your primary care provider if your hands and feet continue to bother you.  If you develop worsening of abdominal pain, fevers, dehydration, blood in stool, or otherwise worsening please return or go to the ER.

## 2018-12-30 ENCOUNTER — Ambulatory Visit (INDEPENDENT_AMBULATORY_CARE_PROVIDER_SITE_OTHER): Payer: Self-pay

## 2018-12-30 ENCOUNTER — Other Ambulatory Visit: Payer: Self-pay

## 2018-12-30 ENCOUNTER — Ambulatory Visit
Admission: EM | Admit: 2018-12-30 | Discharge: 2018-12-30 | Disposition: A | Discharge status: Home / Self Care | Payer: Self-pay | Attending: Family Medicine | Admitting: Family Medicine

## 2018-12-30 ENCOUNTER — Encounter: Payer: Self-pay | Admitting: Emergency Medicine

## 2018-12-30 DIAGNOSIS — X500XXA Overexertion from strenuous movement or load, initial encounter: Secondary | ICD-10-CM

## 2018-12-30 DIAGNOSIS — S46912A Strain of unspecified muscle, fascia and tendon at shoulder and upper arm level, left arm, initial encounter: Secondary | ICD-10-CM

## 2018-12-30 DIAGNOSIS — M25512 Pain in left shoulder: Secondary | ICD-10-CM

## 2018-12-30 MED ORDER — CYCLOBENZAPRINE HCL 10 MG PO TABS: 10.0000 mg | ORAL_TABLET | Freq: Two times a day (BID) | ORAL | 0 refills | Status: DC | PRN

## 2018-12-30 MED ORDER — NAPROXEN 500 MG PO TABS: 500.0000 mg | ORAL_TABLET | Freq: Two times a day (BID) | ORAL | 0 refills | Status: DC

## 2018-12-30 NOTE — ED Provider Notes (Signed)
[place] [place]   Name: Kaitlyn Russell DOB: 1985/09/22 MRN: [id number] CSN: [id number] PCP: [facility]  Arrival date and time:  12/30/18 1006  Chief Complaint:  Shoulder Pain   NOTE: Prior to seeing the patient today, I have reviewed the triage nursing documentation and vital signs. Clinical staff has updated patient's PMH/PSHx, current medication list, and drug allergies/intolerances to ensure comprehensive history available to assist in medical decision making.   History:   HPI: Kaitlyn Shiprica Myricks is a 33 y.o. female who presents today with complaints of LEFT shoulder pain. Patient advising that pain started on Monday and acutely worsened yesterday. Pain in the descending trapezius and anterior shoulder. She notes increased pain with lateral bending and rotation of her neck and abduction of her shoulder. Patient reports a physically demanding job at [employer]. 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 She denies weakness in her extremity and distal paresthesias. Patient asked several times during her encounter today about filing claim under worker's compensation, however she states "I don't know if it will be. My job didn't say anything about it when I reported it this morning". Patient denies previous neck and shoulder injuries; no previous surgeries. Despite her symptoms, patient has not taken any over the counter interventions to help improve/relieve her reported symptoms at home.    Past Medical History:  Diagnosis Date   Asthma     Past Surgical History:  Procedure Laterality Date   NO PAST SURGERIES      Family History  Problem Relation Age of Onset   Healthy Mother    Diabetes Father     Social History   Tobacco Use   Smoking status: Former Smoker    Types: Cigarettes   Smokeless tobacco: Never Used  Substance Use Topics   Alcohol use: No   Drug use: No    Patient Active Problem  List   Diagnosis Date Noted   Preterm premature rupture of membranes 08/15/2016   Labor and delivery indication for care or intervention 08/15/2016   Fetal arrhythmia affecting pregnancy, antepartum 08/15/2016   Anxiety 08/15/2016   Smoking (tobacco) complicating the puerperium 08/15/2016   SVD (spontaneous vaginal delivery) 08/15/2016   Postpartum care following vaginal delivery 08/15/2016   Threatening to others 08/15/2016   Pregnancy 08/13/2016    Home Medications:    Current Meds  Medication Sig   albuterol (VENTOLIN HFA) 108 (90 Base) MCG/ACT inhaler Inhale 1-2 puffs into the lungs every 6 (six) hours as needed for wheezing or shortness of breath.    Allergies:   Prednisone and Latex  Review of Systems (ROS): Review of Systems  Constitutional: Negative for chills and fever.  Respiratory: Negative for cough and shortness of breath.   Cardiovascular: Negative for chest pain and palpitations.  Musculoskeletal: Positive for neck pain and neck stiffness.       Acute LEFT shoulder pain  Neurological: Negative for dizziness, syncope, weakness, numbness and headaches.  All other systems reviewed and are negative.    Vital Signs: Today's Vitals   12/30/18 1116 12/30/18 1119 12/30/18 1228  BP:  111/88   Pulse:  60   Resp:  16   Temp:  98.2 F (36.8 C)   TempSrc:  Oral   SpO2:  100%   Weight: 135 lb (61.2 kg)    Height: 5\' 2"  (1.575 m)    PainSc: 9   9     Physical Exam: Physical Exam  Constitutional: She is oriented to person, place, and time and well-developed, well-nourished, and in no distress.  HENT:  Head: Normocephalic and atraumatic.  Mouth/Throat: Mucous membranes are normal.  Eyes: Pupils are equal, round, and reactive to light.  Neck: Normal range of motion. Neck supple. Muscular tenderness present. No spinous process tenderness present. No neck rigidity. No tracheal deviation, no edema and no erythema present.    No midline pain or gross  deformities.   Cardiovascular: Normal rate, regular rhythm, normal heart sounds and intact distal pulses. Exam reveals no gallop and no friction rub.  No murmur heard. Pulmonary/Chest: Effort normal and breath sounds normal. No respiratory distress. She has no wheezes. She has no rales.  Musculoskeletal:     Left shoulder: She exhibits decreased range of motion (increased pain with abduction), tenderness and spasm. She exhibits no swelling, no effusion, no deformity, normal pulse and normal strength.  Neurological: She is alert and oriented to person, place, and time. She has normal sensation, normal strength and normal reflexes. Gait normal.  Skin: Skin is warm and dry. No rash noted.  Psychiatric: Mood, memory, affect and judgment normal.  Nursing note and vitals reviewed.   Urgent Care Treatments / Results:   LABS: PLEASE NOTE: all labs that were ordered this encounter are listed, however only abnormal results are displayed. Labs Reviewed - No data to display  EKG: -None  RADIOLOGY: Dg Shoulder Left  Result Date: 12/30/2018 CLINICAL DATA:  Shoulder pain EXAM: LEFT SHOULDER - 2+ VIEW COMPARISON:  None. FINDINGS: No fracture or dislocation of the left shoulder. Joint spaces are well preserved. The partially imaged left chest is unremarkable. IMPRESSION: No fracture or dislocation of the left shoulder. Joint spaces are well preserved. Electronically Signed   By: Eddie Candle M.D.   On: 12/30/2018 11:57    PROCEDURES: Procedures  MEDICATIONS RECEIVED THIS VISIT: Medications - No data to display  PERTINENT CLINICAL COURSE NOTES/UPDATES:   Initial Impression / Assessment and Plan / Urgent Care Course:  Pertinent labs & imaging results that were available during my care of the patient were personally reviewed by me and considered in my medical decision making (see lab/imaging section of note for values and interpretations).  Ahsley Attwood is a 33 y.o. female who presents to [facility] today with complaints of Shoulder Pain   Patient is well appearing overall in clinic today. She does not appear to be in any acute distress. Presenting symptoms (see HPI) and exam as documented above. Diagnostic radiographs of the LEFT shoudler revealed no acute abnormalities; no fracture, dislocation, or effusion. Exam reveals area of definitive spasm overlying the LEFT descending trapezius. She denies radicular symptoms. Suspect muscular strain with associated spasm. Will pursue treatment using anti-inflammatory (naprosyn) medication and skeletal muscle relaxer (cyclobenzaprine). Reviewed implications and side effects of prescribed medications. She is aware that SMR can make her somnolent, thus she was advised not to drive or mix medication with other drugs/ETOH.  She was educated on complimentary modalities to help with her pain. Patient encouraged to rest and avoid lifting. Demonstrated ROM and stretching exercises in clinic for patient to employ at home to help with her acute pain.  She will likely find added benefit of applying heat and/or ice TID-QID for at least 15-20 minutes at a time; written information provided on today's AVS.   Clinic registration staff Velna Hatchet) spoke with HR at patient's job to determine need for UDS for worker's compensation claim. HR advised [facility] staff, that injury  was not reported at the time it occurred, therefore it would not be covered under worker's compensation by the company. Patient was made aware of of conversation with her employer's HR representative while in clinic today.   Current clinical condition warrants patient being out of work in order to recover from her current injury/illness. She was provided with the appropriate documentation to provide to her place of employment that will allow for her to RTW on 01/01/2019 with no restrictions.   Discussed follow up with primary care physician in 1 week for re-evaluation. I have reviewed the follow up and  strict return precautions for any new or worsening symptoms. Patient is aware of symptoms that would be deemed urgent/emergent, and would thus require further evaluation either here or in the emergency department. At the time of discharge, she verbalized understanding and consent with the discharge plan as it was reviewed with her. All questions were fielded by provider and/or clinic staff prior to patient discharge.    Final Clinical Impressions / Urgent Care Diagnoses:   Final diagnoses:  Strain of left shoulder, initial encounter    New Prescriptions:  [place] Controlled Substance Registry consulted? Not Applicable  Meds ordered this encounter  Medications   naproxen (NAPROSYN) 500 MG tablet    Sig: Take 1 tablet (500 mg total) by mouth 2 (two) times daily.    Dispense:  30 tablet    Refill:  0   cyclobenzaprine (FLEXERIL) 10 MG tablet    Sig: Take 1 tablet (10 mg total) by mouth 2 (two) times daily as needed for muscle spasms.    Dispense:  20 tablet    Refill:  0    Recommended Follow up Care:  Patient encouraged to follow up with the following provider within the specified time frame, or sooner as dictated by the severity of her symptoms. As always, she was instructed that for any urgent/emergent care needs, she should seek care either here or in the emergency department for more immediate evaluation.  Follow-up Information    Med[facility], [facility] In 1 week.   Why: General reassessment of symptoms if not improving Contact information: [address] [place] [address] [phone]         NOTE: This note was prepared using Dragon dictation software along with smaller phrase technology. Despite my best ability to proofread, there is the potential that transcriptional errors may still occur from this process, and are completely unintentional.    Verlee Monte, NP 12/31/18 1953

## 2018-12-30 NOTE — ED Triage Notes (Signed)
Pt c/o left shoulder pain. Started yesterday. She has decreased ROM without pain. 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 She is unsure if it is going to workers comp.

## 2018-12-30 NOTE — Discharge Instructions (Signed)
It was very nice seeing you today in clinic. Thank you for entrusting me with your care.   Rest shoulder. Apply heat/ice 3-4 times day for at least 15-20 minutes at a time. Please utilize the medications that we discussed. Your prescriptions has been called in to your pharmacy.   Make arrangements to follow up with your regular doctor in 1 week for re-evaluation if not improving. If your symptoms/condition worsens, please seek follow up care either here or in the ER. Please remember, our [facility] providers are "right here with you" when you need [place].   Again, it was my pleasure to take care of you today. Thank you for choosing our clinic. I hope that you start to feel better quickly.   Honor Loh, MSN, APRN, FNP-C, CEN Advanced Practice Provider [facility]

## 2019-01-19 ENCOUNTER — Encounter: Payer: Self-pay | Admitting: Emergency Medicine

## 2019-01-19 ENCOUNTER — Ambulatory Visit
Admission: EM | Admit: 2019-01-19 | Discharge: 2019-01-19 | Disposition: A | Discharge status: Home / Self Care | Payer: Self-pay | Attending: Emergency Medicine | Admitting: Emergency Medicine

## 2019-01-19 ENCOUNTER — Other Ambulatory Visit: Payer: Self-pay

## 2019-01-19 DIAGNOSIS — Z0000 Encounter for general adult medical examination without abnormal findings: Secondary | ICD-10-CM

## 2019-01-19 NOTE — Discharge Instructions (Addendum)
I would discontinue the Naprosyn and Flexeril since you have been without any pain for several days.  Save it in case your shoulder pain comes back.  In the meantime, it is fine for you to return to full duty.  Here is a list of primary care providers who are taking new patients:  Dr. Otilio Miu, Dr. Adline Potter [address] [place] [address] [place] [address]  [phone]  [facility] [address] [place], [place] [address] [phone]  [facility] [place]  [phone] [place], [place] [address]  Here are clinics/ other resources who will see you if you do not have insurance. Some have certain criteria that you must meet. Call them and find out what they are:  [facility]: [address] [place], [place] [address] Phone: [phone] Hours: First and Third Saturdays of each Month, 9 a.m. - 1 p.m.  [facility]: [address], [place], [place], [place] [address] Phone: [phone] Hours: Tuesday, 4 p.m. - 8 p.m. Thursday, 1 p.m. - 8 p.m. Wednesday, 9 a.m. - [facility] [address], [place], [place] [address] Phone: [phone] Pharmacy Phone Number: [phone] Dental Phone Number: [phone] [place] Help: [phone]  Dental Hours: Monday - Thursday, 8 a.m. - 6 p.m.  [facility] [address] [place], [place] [address] Phone: [phone] Pharmacy Phone Number: [phone] [facility] Insurance Help: [phone]  [facility] [address] [place], [place] [address] Phone: [phone] Pharmacy Phone Number: [phone] [facility] Insurance Help: [phone]  [facility] [address] [place], [place] [address] Phone: [phone] [facility] Insurance Help: [phone]   [address] [place], [place]  [address] Phone: [phone]  Go to www.goodrx.com to look up your medications. This will give you a list of where you can find your prescriptions at the most affordable prices. Or ask the pharmacist what the cash price is, or if they have any other discount programs available to help make your medication more affordable. This can be less expensive than what you would pay with insurance.

## 2019-01-19 NOTE — ED Triage Notes (Signed)
Patient states she had a workers comp case that got denied. She needs to be cleared to go back to work. She has been taking the medication that was prescribed here and is not having any pain in her left shoulder.

## 2019-01-19 NOTE — ED Provider Notes (Signed)
HPI  SUBJECTIVE:  Kaitlyn Russell is a 33 y.o. female who presents for clearance to return back to full duty at work.  She injured her left shoulder at work on 11/5, she was seen here, thought to have a left shoulder strain.  Sent home with Naprosyn and Flexeril.  Per chart, she was given documentation and was allowed to return to work on 11/7 with no restrictions.  However patient states that since [employer] denied the claim, she needs a note from this facility stating that she can go back to work.  She has been using her shoulder at home with no problem.  She has not been using it at work per work HR instructions.  She is taking Naprosyn and Flexeril, using a hot patch and physical therapy exercises with significant improvement in symptoms.  No aggravating factors.  She states that she plans to finish the NSAID and Flexeril.  She has been asymptomatic for 1 week.  She denies any pain.  States that she can now lie on her shoulder.  Past medical history of asthma.  No history of diabetes.  LMP: 11/5.  Denies possibility of being pregnant.  PMD: None.   Past Medical History:  Diagnosis Date  . Asthma     Past Surgical History:  Procedure Laterality Date  . NO PAST SURGERIES      Family History  Problem Relation Age of Onset  . Healthy Mother   . Diabetes Father     Social History   Tobacco Use  . Smoking status: Former Smoker    Types: Cigarettes  . Smokeless tobacco: Never Used  Substance Use Topics  . Alcohol use: No  . Drug use: No    No current facility-administered medications for this encounter.   Current Outpatient Medications:  .  albuterol (VENTOLIN HFA) 108 (90 Base) MCG/ACT inhaler, Inhale 1-2 puffs into the lungs every 6 (six) hours as needed for wheezing or shortness of breath., Disp: 18 g, Rfl: 0 .  naproxen (NAPROSYN) 500 MG tablet, Take 1 tablet (500 mg total) by mouth 2 (two) times daily., Disp: 30 tablet, Rfl: 0 .  cyclobenzaprine (FLEXERIL) 10 MG tablet,  Take 1 tablet (10 mg total) by mouth 2 (two) times daily as needed for muscle spasms., Disp: 20 tablet, Rfl: 0  Allergies  Allergen Reactions  . Prednisone   . Latex Rash     ROS  As noted in HPI.   Physical Exam  BP 109/81 (BP Location: Right Arm)   Pulse 63   Temp 98.3 F (36.8 C) (Oral)   Resp 18   Ht 5\' 3"  (1.6 m)   Wt 62.1 kg   LMP 12/30/2018 (Exact Date)   SpO2 100%   BMI 24.27 kg/m   Constitutional: Well developed, well nourished, no acute distress Eyes:  EOMI, conjunctiva normal bilaterally HENT: Normocephalic, atraumatic,mucus membranes moist Respiratory: Normal inspiratory effort Cardiovascular: Normal rate GI: nondistended skin: No rash, skin intact Musculoskeletal: L shoulder with ROM normal , Drop test normal, no Tenderness entire joint, clavicle NT A/C joint NT , scapula NT , proximal humerus NT, shoulder joint NT, Motor strength normal, Sensation intact LT over deltoid region, distal NVI with hand on affected side having grossly intact sensation and strength in the distribution of the median, radial, and ulnar nerve.  no pain with internal rotation,  no pain with external rotation,  negative tenderness in bicipital groove, negative empty can test negative liftoff test.  No tenderness along the trapezius.  Grip strength 5/5 and equal bilaterally.  RP 2+ and equal bilaterally. Neurologic: Alert & oriented x 3, no focal neuro deficits Psychiatric: Speech and behavior appropriate   ED Course   Medications - No data to display  No orders of the defined types were placed in this encounter.   No results found for this or any previous visit (from the past 24 hour(s)). No results found.  ED Clinical Impression  1. Normal physical exam      ED Assessment/Plan  She has been asymptomatic for a week, she has a normal shoulder exam, do not see any reason why she cannot return to full duty.  Will write a note stating as such.  Advised patient to go ahead and  discontinue NSAIDs and Flexeril since she has been asymptomatic for some time.  Providing primary care list for routine care.  Discussed MDM, treatment plan, and plan for follow-up with patient patient agrees with plan.   No orders of the defined types were placed in this encounter.   *This clinic note was created using Dragon dictation software. Therefore, there may be occasional mistakes despite careful proofreading.   ?    Domenick Gong, MD 01/20/19 1655

## 2019-01-25 ENCOUNTER — Other Ambulatory Visit: Payer: Self-pay

## 2019-01-25 ENCOUNTER — Emergency Department
Admission: EM | Admit: 2019-01-25 | Discharge: 2019-01-25 | Disposition: A | Discharge status: Home / Self Care | Payer: Self-pay | Attending: Emergency Medicine | Admitting: Emergency Medicine

## 2019-01-25 DIAGNOSIS — J45909 Unspecified asthma, uncomplicated: Secondary | ICD-10-CM | POA: Diagnosis not present

## 2019-01-25 DIAGNOSIS — M79605 Pain in left leg: Secondary | ICD-10-CM | POA: Insufficient documentation

## 2019-01-25 DIAGNOSIS — M79604 Pain in right leg: Secondary | ICD-10-CM | POA: Insufficient documentation

## 2019-01-25 DIAGNOSIS — M549 Dorsalgia, unspecified: Secondary | ICD-10-CM | POA: Diagnosis present

## 2019-01-25 DIAGNOSIS — Z87891 Personal history of nicotine dependence: Secondary | ICD-10-CM | POA: Insufficient documentation

## 2019-01-25 DIAGNOSIS — M7918 Myalgia, other site: Secondary | ICD-10-CM

## 2019-01-25 MED ORDER — ORPHENADRINE CITRATE ER 100 MG PO TB12: 100.0000 mg | ORAL_TABLET | Freq: Two times a day (BID) | ORAL | 0 refills | Status: DC

## 2019-01-25 MED ORDER — NAPROXEN 500 MG PO TABS: 500.0000 mg | ORAL_TABLET | Freq: Two times a day (BID) | ORAL | Status: DC

## 2019-01-25 NOTE — Discharge Instructions (Signed)
Follow discharge care instructions take medication as directed. °
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
## 2020-07-17 ENCOUNTER — Ambulatory Visit (INDEPENDENT_AMBULATORY_CARE_PROVIDER_SITE_OTHER): Payer: Self-pay

## 2020-07-17 ENCOUNTER — Other Ambulatory Visit: Payer: Self-pay

## 2020-07-17 ENCOUNTER — Ambulatory Visit
Admission: EM | Admit: 2020-07-17 | Discharge: 2020-07-17 | Disposition: A | Discharge status: Home / Self Care | Payer: Self-pay | Attending: Family Medicine | Admitting: Family Medicine

## 2020-07-17 DIAGNOSIS — M79675 Pain in left toe(s): Secondary | ICD-10-CM

## 2020-07-17 DIAGNOSIS — W208XXA Other cause of strike by thrown, projected or falling object, initial encounter: Secondary | ICD-10-CM

## 2020-07-17 DIAGNOSIS — S92919A Unspecified fracture of unspecified toe(s), initial encounter for closed fracture: Secondary | ICD-10-CM

## 2020-07-17 MED ORDER — MELOXICAM 15 MG PO TABS: 15.0000 mg | ORAL_TABLET | Freq: Every day | ORAL | 0 refills | Status: DC | PRN

## 2020-07-17 NOTE — Discharge Instructions (Addendum)
Rest, ice, elevation.  Use the post op shoe for the next several days.  Medication as directed.  Take care  Dr. Adriana Simas

## 2020-07-17 NOTE — ED Provider Notes (Signed)
[facility]    CSN: [id number] Arrival date & time: 07/17/20  1341      History   Chief Complaint Chief Complaint  Patient presents with  . Toe Pain   HPI  35 year old female presents with an injury to her left second toe.  Occurred on Saturday.  She states that she dropped a landscaping log on her toe.  She states that her pain is severe.  She is able to ambulate.  She has significant bruising and swelling.  She has been soaking it in Epson salt and applying Vaseline with some improvement.  No other reported symptoms.  No other complaints.   Past Medical History:  Diagnosis Date  . Asthma     Patient Active Problem List   Diagnosis Date Noted  . Preterm premature rupture of membranes 08/15/2016  . Labor and delivery indication for care or intervention 08/15/2016  . Fetal arrhythmia affecting pregnancy, antepartum 08/15/2016  . Anxiety 08/15/2016  . Smoking (tobacco) complicating the puerperium 08/15/2016  . SVD (spontaneous vaginal delivery) 08/15/2016  . Postpartum care following vaginal delivery 08/15/2016  . Threatening to others 08/15/2016  . Pregnancy 08/13/2016    Past Surgical History:  Procedure Laterality Date  . NO PAST SURGERIES      OB History    Gravida  1   Para  1   Term  0   Preterm  1   AB  0   Living  1     SAB  0   IAB  0   Ectopic  0   Multiple  0   Live Births  1            Home Medications    Prior to Admission medications   Medication Sig Start Date End Date Taking? Authorizing Provider  meloxicam (MOBIC) 15 MG tablet Take 1 tablet (15 mg total) by mouth daily as needed for pain. 07/17/20  Yes Jarell Mcewen G, DO  albuterol (VENTOLIN HFA) 108 (90 Base) MCG/ACT inhaler Inhale 1-2 puffs into the lungs every 6 (six) hours as needed for wheezing or shortness of breath. 08/12/18 07/17/20  Tommie Sams, DO    Family History Family History  Problem Relation Age of Onset  . Healthy Mother   . Diabetes Father      Social History Social History   Tobacco Use  . Smoking status: Former Smoker    Types: Cigarettes  . Smokeless tobacco: Never Used  Vaping Use  . Vaping Use: Never used  Substance Use Topics  . Alcohol use: No  . Drug use: No     Allergies   Prednisone and Latex   Review of Systems Review of Systems  Constitutional: Negative.   Musculoskeletal:       Left second toe - bruising, pain, swelling.   Physical Exam Triage Vital Signs ED Triage Vitals  Enc Vitals Group     BP 07/17/20 1408 116/79     Pulse Rate 07/17/20 1408 69     Resp 07/17/20 1408 18     Temp 07/17/20 1408 98.6 F (37 C)     Temp Source 07/17/20 1408 Oral     SpO2 07/17/20 1408 99 %     Weight 07/17/20 1407 130 lb (59 kg)     Height --      Head Circumference --      Peak Flow --      Pain Score 07/17/20 1407 10     Pain Loc --  Pain Edu? --      Excl. in [place]? --    Updated Vital Signs BP 116/79 (BP Location: Right Arm)   Pulse 69   Temp 98.6 F (37 C) (Oral)   Resp 18   Wt 59 kg   LMP 07/14/2020   SpO2 99%   BMI 23.03 kg/m   Visual Acuity Right Eye Distance:   Left Eye Distance:   Bilateral Distance:    Right Eye Near:   Left Eye Near:    Bilateral Near:     Physical Exam Vitals and nursing note reviewed.  Constitutional:      General: She is not in acute distress.    Appearance: Normal appearance. She is not ill-appearing.  HENT:     Head: Normocephalic and atraumatic.  Eyes:     General:        Right eye: No discharge.        Left eye: No discharge.     Conjunctiva/sclera: Conjunctivae normal.  Pulmonary:     Effort: Pulmonary effort is normal. No respiratory distress.  Musculoskeletal:     Comments: Left second toe -significant bruising and swelling noted distally.  Neurological:     Mental Status: She is alert.  Psychiatric:        Mood and Affect: Mood normal.        Behavior: Behavior normal.    UC Treatments / Results  Labs (all labs ordered are  listed, but only abnormal results are displayed) Labs Reviewed - No data to display  EKG   Radiology DG Toe 2nd Left  Result Date: 07/17/2020 CLINICAL DATA:  Left second toe pain after injury. Piece of wood fell on foot. EXAM: LEFT SECOND TOE COMPARISON:  None. FINDINGS: Mildly comminuted and displaced fracture of the second toe distal tuft. There is no intra-articular extension. No additional fracture of the digit. Normal joint spaces and alignment. IMPRESSION: Mildly comminuted and displaced second toe distal tuft fracture. Electronically Signed   By: Narda Rutherford M.D.   On: 07/17/2020 14:59    Procedures Procedures (including critical care time)  Medications Ordered in UC Medications - No data to display  Initial Impression / Assessment and Plan / UC Course  I have reviewed the triage vital signs and the nursing notes.  Pertinent labs & imaging results that were available during my care of the patient were reviewed by me and considered in my medical decision making (see chart for details).    35 year old female presents with an injury to her left second toe.  X-ray was obtained and was independent reviewed by me.  Interpretation: Mildly displaced fracture of the distal tuft of the second left toe.  Toes were buddy taped.  Placed in postop shoe.  Meloxicam as directed.  Final Clinical Impressions(s) / UC Diagnoses   Final diagnoses:  Displaced fracture of distal phalanx of toe     Discharge Instructions     Rest, ice, elevation.  Use the post op shoe for the next several days.  Medication as directed.  Take care  Dr. Adriana Simas   ED Prescriptions    Medication Sig Dispense Auth. Provider   meloxicam (MOBIC) 15 MG tablet Take 1 tablet (15 mg total) by mouth daily as needed for pain. 30 tablet Tommie Sams, DO     PDMP not reviewed this encounter.   Tommie Sams, [place] 07/17/20 1653

## 2020-07-17 NOTE — ED Triage Notes (Signed)
Pt c/o toe injury to left toe beside big toe. Saturday

## 2021-01-09 ENCOUNTER — Ambulatory Visit
Admission: RE | Admit: 2021-01-09 | Discharge: 2021-01-09 | Disposition: A | Discharge status: Home / Self Care | Payer: No Typology Code available for payment source | Attending: Physician Assistant | Admitting: Physician Assistant

## 2021-01-09 ENCOUNTER — Ambulatory Visit
Admission: RE | Admit: 2021-01-09 | Discharge: 2021-01-09 | Disposition: A | Discharge status: Home / Self Care | Payer: No Typology Code available for payment source | Source: Ambulatory Visit | Attending: Physician Assistant | Admitting: Physician Assistant

## 2021-01-09 ENCOUNTER — Other Ambulatory Visit: Payer: Self-pay | Admitting: Physician Assistant

## 2021-01-09 DIAGNOSIS — W231XXA Caught, crushed, jammed, or pinched between stationary objects, initial encounter: Secondary | ICD-10-CM | POA: Insufficient documentation

## 2021-01-09 DIAGNOSIS — M79645 Pain in left finger(s): Secondary | ICD-10-CM | POA: Insufficient documentation

## 2021-01-09 DIAGNOSIS — Y990 Civilian activity done for income or pay: Secondary | ICD-10-CM

## 2021-12-09 ENCOUNTER — Other Ambulatory Visit: Payer: Self-pay | Admitting: Physician Assistant

## 2021-12-09 ENCOUNTER — Ambulatory Visit
Admission: RE | Admit: 2021-12-09 | Discharge: 2021-12-09 | Disposition: A | Discharge status: Home / Self Care | Payer: No Typology Code available for payment source | Source: Ambulatory Visit | Attending: Physician Assistant | Admitting: Physician Assistant

## 2021-12-09 ENCOUNTER — Ambulatory Visit
Admission: RE | Admit: 2021-12-09 | Discharge: 2021-12-09 | Disposition: A | Discharge status: Home / Self Care | Payer: No Typology Code available for payment source | Attending: Physician Assistant | Admitting: Physician Assistant

## 2021-12-09 ENCOUNTER — Ambulatory Visit: Payer: Self-pay

## 2021-12-09 DIAGNOSIS — S6992XA Unspecified injury of left wrist, hand and finger(s), initial encounter: Secondary | ICD-10-CM

## 2022-01-26 ENCOUNTER — Ambulatory Visit
Admission: RE | Admit: 2022-01-26 | Discharge: 2022-01-26 | Disposition: A | Discharge status: Home / Self Care | Payer: Self-pay | Source: Ambulatory Visit | Attending: Physician Assistant | Admitting: Physician Assistant

## 2022-01-26 ENCOUNTER — Ambulatory Visit (INDEPENDENT_AMBULATORY_CARE_PROVIDER_SITE_OTHER): Payer: Self-pay

## 2022-01-26 VITALS — BP 119/81 | HR 86 | Temp 98.7°F | Resp 14 | Ht 63.0 in | Wt 140.0 lb

## 2022-01-26 DIAGNOSIS — M25532 Pain in left wrist: Secondary | ICD-10-CM

## 2022-01-26 DIAGNOSIS — S63502D Unspecified sprain of left wrist, subsequent encounter: Secondary | ICD-10-CM

## 2022-01-26 MED ORDER — MELOXICAM 15 MG PO TABS: 15.0000 mg | ORAL_TABLET | Freq: Every day | ORAL | 0 refills | Status: DC | PRN

## 2022-01-26 NOTE — ED Provider Notes (Signed)
[facility]    CSN: [id number] Arrival date & time: 01/26/22  0905      History   Chief Complaint Chief Complaint  Patient presents with   Wrist Pain    left    HPI Kaitlyn Russell is a 36 y.o. female.   Patient presents today for evaluation of left radial wrist pain that moves into her forearm.  Reports that approximately 6 weeks ago she was at work when she was pushing a heavy object in her wrist was forcefully extended causing ongoing pain.  She has been evaluated by another provider and had x-rays immediately after injury that were negative.  She has been using a brace as recommended but this actually seems to exacerbate the pain and causes increased swelling.  She has not been using any over-the-counter medication for symptom management.  She is right-handed.  Denies any numbness or paresthesias.  She has not identified any specific triggers of pain.  Pain is rated 4/5 on a pain scale, described as aching, no aggravating relieving factors identified.    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 Authorizing Provider   meloxicam (MOBIC) 15 MG tablet Take 1 tablet (15 mg total) by mouth daily as needed for pain. 01/26/22   Vernard Gram, Noberto Retort, PA-C  albuterol (VENTOLIN HFA) 108 (90 Base) MCG/ACT inhaler Inhale 1-2 puffs into the lungs every 6 (six) hours as needed for wheezing or shortness of breath. 08/12/18 07/17/20  Tommie Sams, DO    Family History Family History  Problem Relation Age of Onset   Healthy Mother    Diabetes Father     Social History Social History   Tobacco Use   Smoking status: Former    Types: Cigarettes   Smokeless tobacco: Never  Vaping Use   Vaping Use: Never used  Substance Use Topics   Alcohol use: No   Drug use: No     Allergies   Prednisone and Latex   Review of Systems Review of Systems  Constitutional:  Positive for activity change. Negative for appetite change, fatigue and fever.  Musculoskeletal:  Positive for arthralgias and joint swelling. Negative for myalgias.  Skin:  Negative for color change and wound.  Neurological:  Negative for weakness and numbness.     Physical Exam Triage Vital Signs ED Triage Vitals  Enc Vitals Group     BP 01/26/22  7939 119/81     Pulse Rate 01/26/22 0922 86     Resp 01/26/22 0922 14     Temp 01/26/22 0922 98.7 F (37.1 C)     Temp Source 01/26/22 0922 Oral     SpO2 01/26/22 0922 97 %     Weight 01/26/22 0920 140 lb (63.5 kg)     Height 01/26/22 0920 5\' 3"  (1.6 m)     Head Circumference --      Peak Flow --      Pain Score 01/26/22 0920 0     Pain Loc --      Pain Edu? --      Excl. in GC? --    No data found.  Updated Vital Signs BP 119/81 (BP Location: Right Arm)   Pulse 86   Temp 98.7 F (37.1 C) (Oral)   Resp 14   Ht 5\' 3"  (1.6 m)   Wt 140 lb (63.5 kg)   LMP 01/01/2022 (Approximate)   SpO2 97%   BMI 24.80 kg/m   Visual Acuity Right Eye Distance:   Left Eye Distance:   Bilateral Distance:    Right Eye Near:   Left Eye Near:    Bilateral Near:     Physical Exam Vitals reviewed.   Constitutional:      General: She is awake. She is not in acute distress.    Appearance: Normal appearance. She is well-developed. She is not ill-appearing.     Comments: Very pleasant female appears stated age in no acute distress sitting comfortably in exam room  HENT:     Head: Normocephalic and atraumatic.  Cardiovascular:     Comments: Capillary refill within 2 seconds left fingers Pulmonary:     Effort: Pulmonary effort is normal.  Musculoskeletal:     Left wrist: Tenderness and crepitus present. No swelling, bony tenderness or snuff box tenderness. Normal range of motion.     Comments: Left wrist/hand: Mild tenderness to palpation over distal radius without deformity.  Normal active range of motion but clicking/painful sensation with rotation of the wrist.  Hand neurovascularly intact.  Negative Finkelstein.  No snuffbox tenderness.  Psychiatric:        Behavior: Behavior is cooperative.      UC Treatments / Results  Labs (all labs ordered are listed, but only abnormal results are displayed) Labs Reviewed - No data to display  EKG   Radiology DG Wrist Complete Left  Result Date: 01/26/2022 CLINICAL DATA:  Pain after injury 2 months ago at work. EXAM: LEFT WRIST - COMPLETE 3+ VIEW COMPARISON:  12/09/2021 FINDINGS: There is no evidence of fracture or dislocation. There is no evidence of arthropathy or other focal bone abnormality. Soft tissues are unremarkable. IMPRESSION: Negative. Electronically Signed   By: 14/04/2021 M.D.   On: 01/26/2022 10:08    Procedures Procedures (including critical care time)  Medications Ordered in UC Medications - No data to display  Initial Impression / Assessment and Plan / UC Course  I have reviewed the triage vital signs and the nursing notes.  Pertinent labs & imaging results that were available during my care of the patient were reviewed by me and considered in my medical decision making (see chart for details).     Repeat  x-ray was obtained given crepitus and persistent pain which showed no acute osseous abnormality.  Discussed likely ligamentous injury as etiology of symptoms.  Recommended that she change her brace to something that is more comfortable and  is not causing worsening pain.  Recommended she continue with RICE protocol.  She was prescribed Mobic to be used for pain management and inflammation with instruction not to take additional NSAIDs with this medication.  She can start using the wrist as tolerated but should avoid any strenuous activity.  Given ongoing discomfort recommended that she follow-up with orthopedics.  She was given contact information for local provider with instruction to call to schedule an appointment.  Discussed that she will need to be cleared by either orthopedics or occupational health before she is able to change the restrictions that were put in place previously.  This is generally not something that we do in urgent care setting.  If she has any worsening or changing symptoms including increased pain, swelling, numbness, paresthesias, weakness she is to be seen immediately.  Final Clinical Impressions(s) / UC Diagnoses   Final diagnoses:  Sprain of left wrist, subsequent encounter  Left wrist pain     Discharge Instructions      Your x-ray is normal so I am concerned about a injury to the soft tissue (such as a ligament).  Please use Mobic to help with pain and inflammation daily.  Do not take additional NSAIDs with this medication including aspirin, ibuprofen/Advil, naproxen/Aleve.  You can use acetaminophen/Tylenol for additional pain relief.  I would use a smaller brace from over-the-counter that does not cause swelling or discomfort as needed.  Given ongoing pain I do recommend he follow-up with a specialist; please call to schedule an appointment with orthopedics.  If anything changes or worsens please return for reevaluation.     ED Prescriptions     Medication Sig  Dispense Auth. Provider   meloxicam (MOBIC) 15 MG tablet Take 1 tablet (15 mg total) by mouth daily as needed for pain. 30 tablet Ashlei Chinchilla, Noberto Retort, PA-C      PDMP not reviewed this encounter.   Jeani Hawking, PA-C 01/26/22 1021

## 2022-01-26 NOTE — ED Triage Notes (Signed)
Patient states that she injured her left forearm and wrist about 2 months ago.  Patient states that was seen for that and her x-ray showed no fracture.  Patient report ongoing pain in her left wrist off and on since the injury.  Patient states that she does have to reapply the splint.  Patient wants to see if she can come off light duty.  Patient states that her worker's comp claim.

## 2022-01-26 NOTE — Discharge Instructions (Signed)
Your x-ray is normal so I am concerned about a injury to the soft tissue (such as a ligament).  Please use Mobic to help with pain and inflammation daily.  Do not take additional NSAIDs with this medication including aspirin, ibuprofen/Advil, naproxen/Aleve.  You can use acetaminophen/Tylenol for additional pain relief.  I would use a smaller brace from over-the-counter that does not cause swelling or discomfort as needed.  Given ongoing pain I do recommend he follow-up with a specialist; please call to schedule an appointment with orthopedics.  If anything changes or worsens please return for reevaluation.

## 2022-08-04 ENCOUNTER — Ambulatory Visit (INDEPENDENT_AMBULATORY_CARE_PROVIDER_SITE_OTHER): Payer: Self-pay

## 2022-08-04 ENCOUNTER — Ambulatory Visit
Admission: EM | Admit: 2022-08-04 | Discharge: 2022-08-04 | Disposition: A | Discharge status: Home / Self Care | Payer: Self-pay | Attending: Family Medicine | Admitting: Family Medicine

## 2022-08-04 DIAGNOSIS — M79675 Pain in left toe(s): Secondary | ICD-10-CM

## 2022-08-04 MED ORDER — NAPROXEN 500 MG PO TABS: 500.0000 mg | ORAL_TABLET | Freq: Two times a day (BID) | ORAL | 0 refills | Status: AC

## 2022-08-04 NOTE — ED Provider Notes (Signed)
[facility]    CSN: [id number] Arrival date & time: 08/04/22  0847      History   Chief Complaint Chief Complaint  Patient presents with   Toe Injury    RT big toe    HPI  HPI Natasha Burda is a 37 y.o. female.   Salinda presents for right big toe pain.  Patient was at an older home and the hinge fell off the and the door fell and injured her right big toe.  She had immediate pain.  Continued has to have throbbing pain when she walks.  Does not feel like her legs are weak.  She tried some over-the-counter medications without relief.  She has to wear steel toe shoes at work but feels like she is unable to do so because her foot keeps throbbing.    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 Authorizing Provider  naproxen (NAPROSYN) 500 MG tablet Take 1 tablet (500 mg total) by mouth 2 (two) times daily with a meal. 08/04/22  Yes Jacarri Gesner, DO  albuterol (VENTOLIN HFA) 108 (90 Base) MCG/ACT inhaler Inhale 1-2 puffs into the lungs every 6 (six) hours as needed for wheezing or shortness of breath. 08/12/18 07/17/20  Tommie Sams, DO    Family History Family History  Problem Relation Age of Onset   Healthy Mother    Diabetes Father     Social History Social History   Tobacco Use   Smoking status: Former    Types: Cigarettes   Smokeless tobacco: Never  Vaping Use   Vaping Use: Never used  Substance Use Topics   Alcohol use: No   Drug use: No     Allergies   Latex and Prednisone   Review of Systems Review of Systems: :negative unless otherwise stated in HPI.      Physical Exam Triage Vital Signs ED Triage Vitals [08/04/22 1011]  Enc Vitals Group     BP 103/82     Pulse Rate 63     Resp      Temp 98.3 F (36.8 C)     Temp Source Oral     SpO2 97 %     Weight      Height      Head Circumference      Peak Flow      Pain Score 0  Pain Loc      Pain Edu?      Excl. in GC?    No data found.  Updated Vital Signs BP 103/82 (BP Location: Right Arm)   Pulse 63   Temp 98.3 F (36.8 C) (Oral)   LMP 07/04/2022 (Approximate)   SpO2 97%   Visual Acuity Right Eye Distance:   Left Eye Distance:   Bilateral Distance:    Right Eye Near:   Left Eye Near:    Bilateral Near:     Physical Exam GEN: well appearing female in no acute distress  CVS: well perfused  RESP: speaking in full sentences without pause, no respiratory distress  MSK:   Ankle/Foot, right: TTP noted at the great toe with purplish discoloration of nail. No visible erythema, swelling, ecchymosis, or bony deformity; Range of motion is full in all directions. Strength is 5/5 in all directions. No tenderness at the insertion/body/myotendinous junction of the Achilles tendon; No tenderness on posterior aspects of lateral and medial malleolus; Unremarkable squeeze; Talar dome non-tender; Unremarkable calcaneal squeeze; No tenderness over the navicular prominence or  over cuboid; No pain at base of 5th MT; No tenderness at the distal metatarsals; Able to walk 4 steps.     UC Treatments / Results  Labs (all labs  ordered are listed, but only abnormal results are displayed) Labs Reviewed - No data to display  EKG   Radiology DG Toe Great Right  Result Date: 08/04/2022 CLINICAL DATA:  dropped cabinet door on RT big toe Big toe injury, pain. EXAM: RIGHT GREAT TOE COMPARISON:  None Available. FINDINGS: There is no evidence of fracture or dislocation. Joint spaces and alignment are preserved. The included metatarsal and adjacent digits are intact. Soft tissues are unremarkable. IMPRESSION: No fracture or dislocation of the right great toe. Electronically Signed   By: Narda Rutherford M.D.   On: 08/04/2022 11:05     Procedures Nail Removal  Date/Time: 08/04/2022 11:41 AM  Performed by: Katha Cabal, DO Authorized by: Katha Cabal, DO   Consent:    Consent obtained:  Verbal   Consent given by:  Patient   Risks, benefits, and alternatives were discussed: yes   Universal protocol:    Patient identity confirmed:  Verbally with patient Location:    Foot:  R big toe Pre-procedure details:    Skin preparation:  Alcohol (wound cleanser) Trephination:    Subungual hematoma drained: yes     Trephination instrument:  Needle Post-procedure details:    Dressing:  Tube gauze   Procedure completion:  Tolerated  (including critical care time)  Medications Ordered in UC Medications - No data to display  Initial Impression / Assessment and Plan / UC Course  I have reviewed the triage vital signs and the nursing notes.  Pertinent labs & imaging results that were available during my care of the patient were reviewed by me and considered in my medical decision making (see chart for details).      Pt is a 37 y.o.  female with acute right great toe pain after an injury at home. On exam, pt has tenderness at great toe concerning for fracture.   Obtained  right great toe plain films.  Personally interpreted by me were unremarkable for fracture or dislocation. Radiologist report reviewed and additionally  notes  no soft tissue swelling.  Discussed nail trephination and she is agreeable. Moderate amount of blood released.   Patient to gradually return to normal activities, as tolerated and continue ordinary  activities within the limits permitted by pain. Prescribed Naproxen sodium for pain relief.  Tylenol PRN. Advised patient to avoid OTC NSAIDs while taking prescription NSAID. Counseled patient on red flag symptoms and when to seek immediate care.    Patient to follow up with orthopedic provider, if symptoms do not improve with conservative treatment.  Return and ED precautions given. Understanding voiced. Discussed MDM, treatment plan and plan for follow-up with patient who agrees with plan. Work note provided.   Final Clinical Impressions(s) / UC Diagnoses   Final diagnoses:  Great toe pain, left     Discharge Instructions      Your x-ray did not show a broken or dislocated bone. Take naprosyn as prescribed.      ED Prescriptions     Medication Sig Dispense Auth. Provider   naproxen (NAPROSYN) 500 MG tablet Take 1 tablet (500 mg total) by mouth 2 (two) times daily with a meal. 30 tablet Monea Pesantez, DO      PDMP not reviewed this encounter.   Katha Cabal, DO 08/04/22 1143

## 2022-08-04 NOTE — ED Triage Notes (Addendum)
Pt presents to UC for RT big toe injury, pt states wood cabinet door fell on RT big toe after hinge broke off of cabinet. Pt states she wears steel toe boots for work and can't put them on.

## 2022-08-04 NOTE — Discharge Instructions (Addendum)
Your x-ray did not show a broken or dislocated bone. Take naprosyn as prescribed.

## 2022-08-13 IMAGING — CR DG FINGER INDEX 2+V*L*
1 series · 3 of 3 positions shown · non-contrast
Comparison: None.

CLINICAL DATA: Left index finger pain after injury today

EXAM:
LEFT INDEX FINGER 2+V

[Series 1: dg finger index left · 0.14mm/px · 3 of 3 slices shown]
[im 1/3]
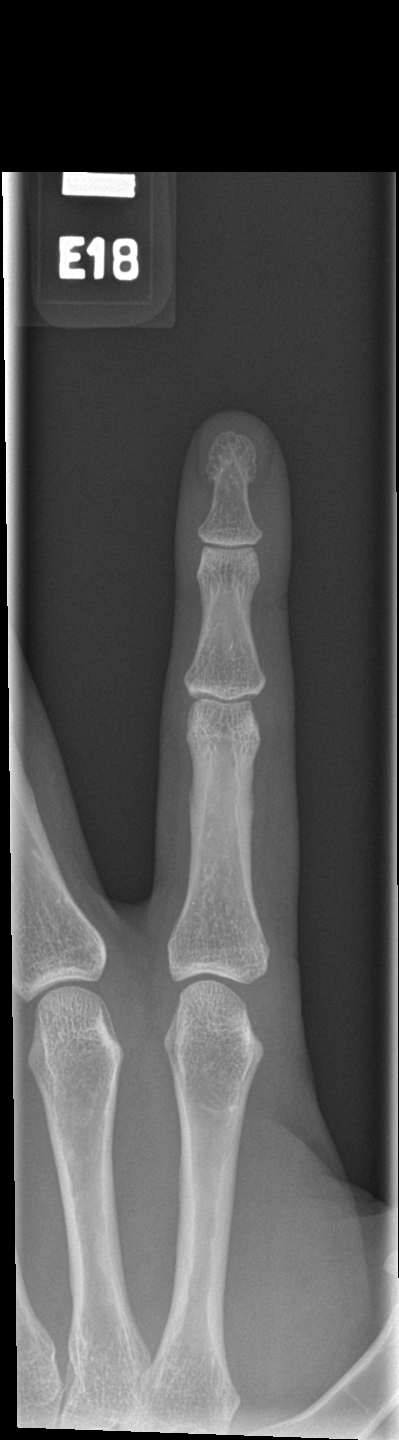
[im 2/3]
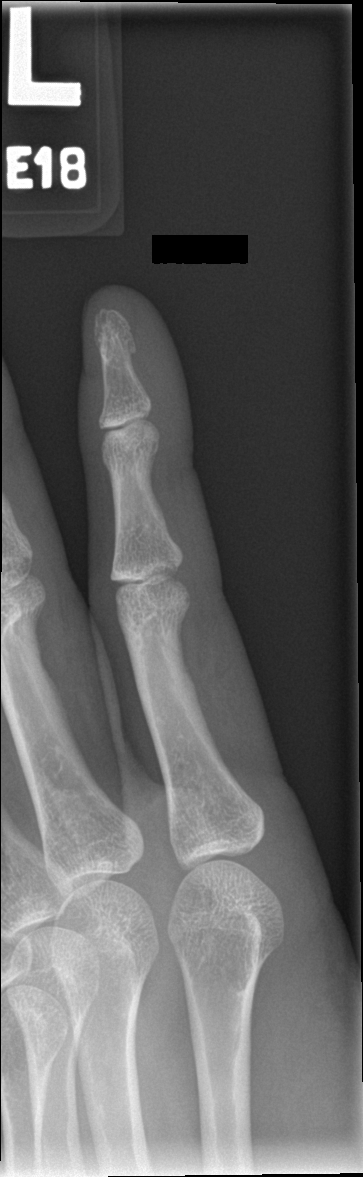
[im 3/3]
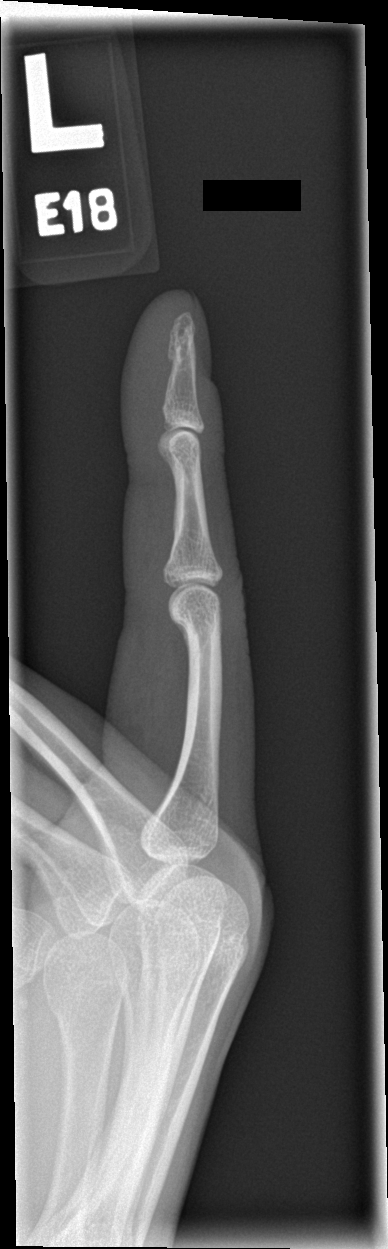

[3 of 3 positions shown; findings below may reference images not displayed]

FINDINGS: There is no evidence of fracture or dislocation. There is no
evidence of arthropathy or other focal bone abnormality. Soft
tissues are unremarkable.
IMPRESSION: Negative.

## 2023-11-11 ENCOUNTER — Ambulatory Visit
Admission: RE | Admit: 2023-11-11 | Discharge: 2023-11-11 | Disposition: A | Discharge status: Home / Self Care | Payer: Self-pay | Source: Ambulatory Visit

## 2023-11-11 ENCOUNTER — Ambulatory Visit: Payer: Self-pay | Admitting: *Deleted

## 2023-11-11 VITALS — BP 123/86 | HR 74 | Temp 98.0°F | Resp 18

## 2023-11-11 DIAGNOSIS — J4521 Mild intermittent asthma with (acute) exacerbation: Secondary | ICD-10-CM

## 2023-11-11 MED ORDER — ALBUTEROL SULFATE HFA 108 (90 BASE) MCG/ACT IN AERS: 1.0000 | INHALATION_SPRAY | Freq: Four times a day (QID) | RESPIRATORY_TRACT | 2 refills | Status: AC | PRN

## 2023-11-11 MED ORDER — METHYLPREDNISOLONE 4 MG PO TBPK: ORAL_TABLET | ORAL | 0 refills | Status: AC

## 2023-11-11 MED ORDER — IPRATROPIUM-ALBUTEROL 0.5-2.5 (3) MG/3ML IN SOLN
3.0000 mL | Freq: Once | RESPIRATORY_TRACT | Status: AC
  Administered 2023-11-11: 3 mL via RESPIRATORY_TRACT

## 2023-11-11 NOTE — Discharge Instructions (Addendum)
  1. Mild intermittent asthma with acute exacerbation (Primary) - ipratropium-albuterol  (DUONEB) 0.5-2.5 (3) MG/3ML nebulizer solution 3 mL given in UC for acute wheezing and asthma exacerbation - albuterol  (VENTOLIN  HFA) 108 (90 Base) MCG/ACT inhaler; Inhale 1-2 puffs into the lungs every 6 (six) hours as needed for wheezing or shortness of breath.  Dispense: 8 g; Refill: 2 - methylPREDNISolone  (MEDROL  DOSEPAK) 4 MG TBPK tablet; Take prescribed medication as directed for 5 days.  Medication will be tapered over 5-day period.  Take entire dose of medication until complete.  Dispense: 21 each; Refill: 0 -Continue to monitor symptoms for any change in severity if there is any escalation of current symptoms or development of new symptoms follow-up in ER for further evaluation and management.

## 2023-11-11 NOTE — ED Provider Notes (Signed)
 [facility]  Note:  This document was prepared using Conservation officer, historic buildings and may include unintentional dictation errors.  MRN: [id number] DOB: [birth year]  Subjective:   Kaitlyn Russell is a 38 y.o. female presenting for evaluation of acute dyspnea with wheezing since 630 this morning.  Patient reports that she was okay until she got to work and works in a warehouse with poor ventilation.  Patient reports that fire and rescue was called due to her shortness of breath, EKG was performed on site which was normal.  Patient has history of asthma and currently does not have any albuterol .  Patient was advised by EMS to come for evaluation in urgent care or ER which prompted her to come to urgent care for evaluation.  Patient states that allergies and poor ventilation usually trigger it has been attack.  Patient denies any shortness of breath, chest pain, weakness, dizziness at this time.  No current facility-administered medications for this encounter.  Current Outpatient Medications:    albuterol  (VENTOLIN  HFA) 108 (90 Base) MCG/ACT inhaler, Inhale 1-2 puffs into the lungs every 6 (six) hours as needed for wheezing or shortness of breath., Disp: 8 g, Rfl: 2   methylPREDNISolone  (MEDROL  DOSEPAK) 4 MG TBPK tablet, Take prescribed medication as directed for 5 days.  Medication will be tapered over 5-day period.  Take entire dose of medication until complete., Disp: 21 each, Rfl: 0   naproxen  (NAPROSYN ) 500 MG tablet, Take 1 tablet (500 mg total) by mouth 2 (two) times daily with a meal., Disp: 30 tablet, Rfl: 0   Allergies  Allergen Reactions   Latex Rash   Prednisone Rash    Nose bleed    Past Medical History:  Diagnosis Date   Asthma      Past Surgical History:  Procedure Laterality Date   NO PAST SURGERIES      Family History  Problem Relation Age of Onset   Healthy Mother    Diabetes Father     Social History   Tobacco Use   Smoking status: Former     Types: Cigarettes   Smokeless tobacco: Never  Vaping Use   Vaping status: Never Used  Substance Use Topics   Alcohol use: No   Drug use: No    ROS Refer to HPI for ROS details.  Objective:   Vitals: BP 123/86 (BP Location: Left Arm)   Pulse 74   Temp 98 F (36.7 C) (Tympanic)   Resp 18   LMP 10/28/2023 (Approximate)   SpO2 99%   Physical Exam Vitals and nursing note reviewed.  Constitutional:      General: She is not in acute distress.    Appearance: She is well-developed. She is not ill-appearing or toxic-appearing.  HENT:     Head: Normocephalic and atraumatic.     Nose: Nose normal. No congestion or rhinorrhea.     Mouth/Throat:     Mouth: Mucous membranes are moist.     Pharynx: No oropharyngeal exudate or posterior oropharyngeal erythema.  Eyes:     General:        Right eye: No discharge.        Left eye: No discharge.     Extraocular Movements: Extraocular movements intact.     Conjunctiva/sclera: Conjunctivae normal.  Cardiovascular:     Rate and Rhythm: Normal rate and regular rhythm.     Heart sounds: Normal heart sounds. No murmur heard. Pulmonary:     Effort: Pulmonary effort is normal. No respiratory  distress.     Breath sounds: No stridor. Wheezing present. No rhonchi or rales.  Chest:     Chest wall: No tenderness.  Skin:    General: Skin is warm and dry.  Neurological:     General: No focal deficit present.     Mental Status: She is alert and oriented to person, place, and time.  Psychiatric:        Mood and Affect: Mood normal.        Behavior: Behavior normal.     Procedures  No results found for this or any previous visit (from the past 24 hours).  No results found.   Assessment and Plan :     Discharge Instructions       1. Mild intermittent asthma with acute exacerbation (Primary) - ipratropium-albuterol  (DUONEB) 0.5-2.5 (3) MG/3ML nebulizer solution 3 mL given in UC for acute wheezing and asthma exacerbation - albuterol   (VENTOLIN  HFA) 108 (90 Base) MCG/ACT inhaler; Inhale 1-2 puffs into the lungs every 6 (six) hours as needed for wheezing or shortness of breath.  Dispense: 8 g; Refill: 2 - methylPREDNISolone  (MEDROL  DOSEPAK) 4 MG TBPK tablet; Take prescribed medication as directed for 5 days.  Medication will be tapered over 5-day period.  Take entire dose of medication until complete.  Dispense: 21 each; Refill: 0 -Continue to monitor symptoms for any change in severity if there is any escalation of current symptoms or development of new symptoms follow-up in ER for further evaluation and management.      Ethan Kasperski [facility] [place]   Kaitlyn Russell, [place], [place] 11/11/23 1749

## 2023-11-11 NOTE — Telephone Encounter (Signed)
 No PCP   FYI Only or Action Required?: Action required by provider: clinical question for provider and does not want PCP at this time due to no insurance .  Patient was last seen in primary care on na.  Called Nurse Triage reporting Breathing Problem.  Symptoms began today.  Interventions attempted: Rest, hydration, or home remedies.  Symptoms are: gradually worsening.  Triage Disposition: See HCP Within 4 Hours (Or PCP Triage)  Patient/caregiver understands and will follow disposition?: Unsure             Copied from CRM [id number]. Topic: Clinical - Red Word Triage >> Nov 11, 2023  9:57 AM Kaitlyn Russell wrote: [facility] that prompted transfer to Nurse Triage: Breathing issues, started this morning, patient cannot catch breath. Reason for Disposition  [1] MILD difficulty breathing (Russell.g., minimal/no SOB at rest, SOB with walking, pulse < 100) AND [2] NEW-onset or WORSE than normal  Answer Assessment - Initial Assessment Questions Patient requesting if she should wait until appt at UC at 5:30 pm to be evaluated. Due to sx no chest pain no difficulty breathing at rest. Recommended to go to UC and see wait time and to go to ED if sx worsen before appt this am.  Patient concerned due to financial strain.       1. RESPIRATORY STATUS: Describe your breathing? (Russell.g., wheezing, shortness of breath, unable to speak, severe coughing)      Difficulty breathing in and feels like difficulty breathing in but can breathe out. 2. ONSET: When did this breathing problem begin?      This am  3. PATTERN Does the difficult breathing come and go, or has it been constant since it started?      Comes and goes when breathing in  4. SEVERITY: How bad is your breathing? (Russell.g., mild, moderate, severe)      Mild at rest but worsening with breathing in  5. RECURRENT SYMPTOM: Have you had difficulty breathing before? If Yes, ask: When was the last time? and What happened that time?       This am  6. CARDIAC HISTORY: Do you have any history of heart disease? (Russell.g., heart attack, angina, bypass surgery, angioplasty)      na 7. LUNG HISTORY: Do you have any history of lung disease?  (Russell.g., pulmonary embolus, asthma, emphysema)     Hx asthma  8. CAUSE: What do you think is causing the breathing problem?      Not sure  9. OTHER SYMPTOMS: Do you have any other symptoms? (Russell.g., chest pain, cough, dizziness, fever, runny nose)     No chest pain no dizziness, difficulty breathing in  10. O2 SATURATION MONITOR:  Do you use an oxygen saturation monitor (pulse oximeter) at home? If Yes, ask: What is your reading (oxygen level) today? What is your usual oxygen saturation reading? (Russell.g., 95%)       na 11. PREGNANCY: Is there any chance you are pregnant? When was your last menstrual period?       na 12. TRAVEL: Have you traveled out of the country in the last month? (Russell.g., travel history, exposures)       na  Protocols used: Breathing Difficulty-A-AH

## 2023-11-11 NOTE — ED Triage Notes (Signed)
 Patient report SOB that started at 6:30 am. Patient was at work when then happened. [employer] [facility] [employer] checked her and did 12 lead EKG on her. Patient has history of asthma. Patient also reports she does not have an inhaler.

## 2024-02-05 ENCOUNTER — Telehealth: Payer: Self-pay

## 2024-02-10 NOTE — Telephone Encounter (Signed)
 error
